# Patient Record
Sex: Female | Born: 1940
Health system: Southern US, Community
[De-identification: ages and names within clinical notes are randomized; demographics above are authoritative.]

## PROBLEM LIST (undated history)

## (undated) DIAGNOSIS — I350 Nonrheumatic aortic (valve) stenosis: Secondary | ICD-10-CM

## (undated) DIAGNOSIS — I10 Essential (primary) hypertension: Secondary | ICD-10-CM

## (undated) DIAGNOSIS — Z87442 Personal history of urinary calculi: Secondary | ICD-10-CM

## (undated) DIAGNOSIS — G4730 Sleep apnea, unspecified: Secondary | ICD-10-CM

## (undated) DIAGNOSIS — H8110 Benign paroxysmal vertigo, unspecified ear: Secondary | ICD-10-CM

## (undated) DIAGNOSIS — E7800 Pure hypercholesterolemia, unspecified: Secondary | ICD-10-CM

## (undated) DIAGNOSIS — I6529 Occlusion and stenosis of unspecified carotid artery: Secondary | ICD-10-CM

## (undated) HISTORY — DX: Pure hypercholesterolemia, unspecified: E78.00

## (undated) HISTORY — DX: Benign paroxysmal vertigo, unspecified ear: H81.10

## (undated) HISTORY — DX: Occlusion and stenosis of unspecified carotid artery: I65.29

## (undated) HISTORY — DX: Essential (primary) hypertension: I10

## (undated) HISTORY — PX: COARCTATION OF AORTA REPAIR: SHX261

## (undated) HISTORY — DX: Nonrheumatic aortic (valve) stenosis: I35.0

## (undated) HISTORY — DX: Personal history of urinary calculi: Z87.442

## (undated) HISTORY — DX: Sleep apnea, unspecified: G47.30

## (undated) HISTORY — PX: BREAST BIOPSY: SHX20

---

## 2011-03-08 DIAGNOSIS — H8110 Benign paroxysmal vertigo, unspecified ear: Secondary | ICD-10-CM | POA: Diagnosis not present

## 2011-03-08 DIAGNOSIS — I10 Essential (primary) hypertension: Secondary | ICD-10-CM | POA: Diagnosis not present

## 2011-03-17 DIAGNOSIS — I10 Essential (primary) hypertension: Secondary | ICD-10-CM | POA: Diagnosis not present

## 2011-03-17 DIAGNOSIS — H905 Unspecified sensorineural hearing loss: Secondary | ICD-10-CM | POA: Diagnosis not present

## 2011-03-17 DIAGNOSIS — G4733 Obstructive sleep apnea (adult) (pediatric): Secondary | ICD-10-CM | POA: Diagnosis not present

## 2011-03-17 DIAGNOSIS — H8190 Unspecified disorder of vestibular function, unspecified ear: Secondary | ICD-10-CM | POA: Diagnosis not present

## 2011-04-11 DIAGNOSIS — R42 Dizziness and giddiness: Secondary | ICD-10-CM | POA: Diagnosis not present

## 2011-04-27 DIAGNOSIS — R42 Dizziness and giddiness: Secondary | ICD-10-CM | POA: Diagnosis not present

## 2011-04-27 DIAGNOSIS — H8110 Benign paroxysmal vertigo, unspecified ear: Secondary | ICD-10-CM | POA: Diagnosis not present

## 2011-04-27 DIAGNOSIS — H8190 Unspecified disorder of vestibular function, unspecified ear: Secondary | ICD-10-CM | POA: Diagnosis not present

## 2011-11-24 DIAGNOSIS — Z23 Encounter for immunization: Secondary | ICD-10-CM | POA: Diagnosis not present

## 2011-11-24 DIAGNOSIS — R82998 Other abnormal findings in urine: Secondary | ICD-10-CM | POA: Diagnosis not present

## 2011-11-24 DIAGNOSIS — I10 Essential (primary) hypertension: Secondary | ICD-10-CM | POA: Diagnosis not present

## 2011-11-24 DIAGNOSIS — E7800 Pure hypercholesterolemia, unspecified: Secondary | ICD-10-CM | POA: Diagnosis not present

## 2011-11-24 DIAGNOSIS — Z79899 Other long term (current) drug therapy: Secondary | ICD-10-CM | POA: Diagnosis not present

## 2011-11-24 DIAGNOSIS — F411 Generalized anxiety disorder: Secondary | ICD-10-CM | POA: Diagnosis not present

## 2012-12-07 DIAGNOSIS — I10 Essential (primary) hypertension: Secondary | ICD-10-CM | POA: Diagnosis not present

## 2012-12-07 DIAGNOSIS — Z79899 Other long term (current) drug therapy: Secondary | ICD-10-CM | POA: Diagnosis not present

## 2012-12-07 DIAGNOSIS — Z23 Encounter for immunization: Secondary | ICD-10-CM | POA: Diagnosis not present

## 2012-12-07 DIAGNOSIS — Z0000 Encounter for general adult medical examination without abnormal findings: Secondary | ICD-10-CM | POA: Diagnosis not present

## 2012-12-07 DIAGNOSIS — E7800 Pure hypercholesterolemia, unspecified: Secondary | ICD-10-CM | POA: Diagnosis not present

## 2012-12-07 DIAGNOSIS — F411 Generalized anxiety disorder: Secondary | ICD-10-CM | POA: Diagnosis not present

## 2013-02-12 DIAGNOSIS — I10 Essential (primary) hypertension: Secondary | ICD-10-CM | POA: Diagnosis not present

## 2013-02-12 DIAGNOSIS — R52 Pain, unspecified: Secondary | ICD-10-CM | POA: Diagnosis not present

## 2013-02-12 DIAGNOSIS — M549 Dorsalgia, unspecified: Secondary | ICD-10-CM | POA: Diagnosis not present

## 2013-09-24 DIAGNOSIS — M542 Cervicalgia: Secondary | ICD-10-CM | POA: Diagnosis not present

## 2013-09-24 DIAGNOSIS — I10 Essential (primary) hypertension: Secondary | ICD-10-CM | POA: Diagnosis not present

## 2013-10-29 DIAGNOSIS — H2510 Age-related nuclear cataract, unspecified eye: Secondary | ICD-10-CM | POA: Diagnosis not present

## 2013-10-29 DIAGNOSIS — H40019 Open angle with borderline findings, low risk, unspecified eye: Secondary | ICD-10-CM | POA: Diagnosis not present

## 2013-11-01 DIAGNOSIS — H40019 Open angle with borderline findings, low risk, unspecified eye: Secondary | ICD-10-CM | POA: Diagnosis not present

## 2013-11-11 DIAGNOSIS — H4011X0 Primary open-angle glaucoma, stage unspecified: Secondary | ICD-10-CM | POA: Diagnosis not present

## 2013-12-02 DIAGNOSIS — H4010X1 Unspecified open-angle glaucoma, mild stage: Secondary | ICD-10-CM | POA: Diagnosis not present

## 2013-12-23 DIAGNOSIS — Z23 Encounter for immunization: Secondary | ICD-10-CM | POA: Diagnosis not present

## 2013-12-30 DIAGNOSIS — H4011X2 Primary open-angle glaucoma, moderate stage: Secondary | ICD-10-CM | POA: Diagnosis not present

## 2013-12-30 DIAGNOSIS — H401133 Primary open-angle glaucoma, bilateral, severe stage: Secondary | ICD-10-CM

## 2013-12-30 DIAGNOSIS — H2513 Age-related nuclear cataract, bilateral: Secondary | ICD-10-CM

## 2013-12-30 DIAGNOSIS — H4011X3 Primary open-angle glaucoma, severe stage: Secondary | ICD-10-CM | POA: Diagnosis not present

## 2013-12-30 HISTORY — DX: Primary open-angle glaucoma, bilateral, severe stage: H40.1133

## 2013-12-30 HISTORY — DX: Age-related nuclear cataract, bilateral: H25.13

## 2014-01-03 DIAGNOSIS — I10 Essential (primary) hypertension: Secondary | ICD-10-CM | POA: Diagnosis not present

## 2014-01-03 DIAGNOSIS — G4733 Obstructive sleep apnea (adult) (pediatric): Secondary | ICD-10-CM | POA: Diagnosis not present

## 2014-01-03 DIAGNOSIS — Z0000 Encounter for general adult medical examination without abnormal findings: Secondary | ICD-10-CM | POA: Diagnosis not present

## 2014-02-14 DIAGNOSIS — H4011X3 Primary open-angle glaucoma, severe stage: Secondary | ICD-10-CM | POA: Diagnosis not present

## 2014-02-14 DIAGNOSIS — H4011X2 Primary open-angle glaucoma, moderate stage: Secondary | ICD-10-CM | POA: Diagnosis not present

## 2014-02-17 DIAGNOSIS — I10 Essential (primary) hypertension: Secondary | ICD-10-CM | POA: Diagnosis not present

## 2014-02-17 DIAGNOSIS — H409 Unspecified glaucoma: Secondary | ICD-10-CM | POA: Diagnosis not present

## 2014-04-01 DIAGNOSIS — Z79899 Other long term (current) drug therapy: Secondary | ICD-10-CM | POA: Diagnosis not present

## 2014-04-01 DIAGNOSIS — H44513 Absolute glaucoma, bilateral: Secondary | ICD-10-CM | POA: Diagnosis not present

## 2014-04-01 DIAGNOSIS — I10 Essential (primary) hypertension: Secondary | ICD-10-CM | POA: Diagnosis not present

## 2014-04-01 DIAGNOSIS — E780 Pure hypercholesterolemia: Secondary | ICD-10-CM | POA: Diagnosis not present

## 2014-05-02 DIAGNOSIS — N3001 Acute cystitis with hematuria: Secondary | ICD-10-CM | POA: Diagnosis not present

## 2014-05-05 DIAGNOSIS — N202 Calculus of kidney with calculus of ureter: Secondary | ICD-10-CM | POA: Diagnosis not present

## 2014-05-05 DIAGNOSIS — N132 Hydronephrosis with renal and ureteral calculous obstruction: Secondary | ICD-10-CM | POA: Diagnosis not present

## 2014-05-05 DIAGNOSIS — R310 Gross hematuria: Secondary | ICD-10-CM | POA: Diagnosis not present

## 2014-05-05 DIAGNOSIS — R109 Unspecified abdominal pain: Secondary | ICD-10-CM | POA: Diagnosis not present

## 2014-05-23 DIAGNOSIS — H4011X3 Primary open-angle glaucoma, severe stage: Secondary | ICD-10-CM | POA: Diagnosis not present

## 2014-05-23 DIAGNOSIS — H4011X2 Primary open-angle glaucoma, moderate stage: Secondary | ICD-10-CM | POA: Diagnosis not present

## 2014-06-23 DIAGNOSIS — H2513 Age-related nuclear cataract, bilateral: Secondary | ICD-10-CM | POA: Diagnosis not present

## 2014-06-23 DIAGNOSIS — H4011X3 Primary open-angle glaucoma, severe stage: Secondary | ICD-10-CM | POA: Diagnosis not present

## 2014-07-17 DIAGNOSIS — H4011X3 Primary open-angle glaucoma, severe stage: Secondary | ICD-10-CM | POA: Diagnosis not present

## 2014-08-04 DIAGNOSIS — H4011X3 Primary open-angle glaucoma, severe stage: Secondary | ICD-10-CM | POA: Diagnosis not present

## 2014-08-04 DIAGNOSIS — H4011X2 Primary open-angle glaucoma, moderate stage: Secondary | ICD-10-CM | POA: Diagnosis not present

## 2014-08-29 DIAGNOSIS — Z87442 Personal history of urinary calculi: Secondary | ICD-10-CM | POA: Diagnosis not present

## 2014-08-29 DIAGNOSIS — R319 Hematuria, unspecified: Secondary | ICD-10-CM | POA: Diagnosis not present

## 2014-08-29 DIAGNOSIS — I10 Essential (primary) hypertension: Secondary | ICD-10-CM | POA: Diagnosis not present

## 2014-09-03 DIAGNOSIS — H2513 Age-related nuclear cataract, bilateral: Secondary | ICD-10-CM | POA: Diagnosis not present

## 2014-09-03 DIAGNOSIS — H4011X3 Primary open-angle glaucoma, severe stage: Secondary | ICD-10-CM | POA: Diagnosis not present

## 2014-09-11 DIAGNOSIS — N3001 Acute cystitis with hematuria: Secondary | ICD-10-CM | POA: Diagnosis not present

## 2014-09-23 DIAGNOSIS — N1330 Unspecified hydronephrosis: Secondary | ICD-10-CM | POA: Diagnosis not present

## 2014-09-23 DIAGNOSIS — N200 Calculus of kidney: Secondary | ICD-10-CM | POA: Diagnosis not present

## 2014-09-23 DIAGNOSIS — N132 Hydronephrosis with renal and ureteral calculous obstruction: Secondary | ICD-10-CM | POA: Diagnosis not present

## 2014-09-30 DIAGNOSIS — Z9181 History of falling: Secondary | ICD-10-CM | POA: Diagnosis not present

## 2014-09-30 DIAGNOSIS — Z1389 Encounter for screening for other disorder: Secondary | ICD-10-CM | POA: Diagnosis not present

## 2014-09-30 DIAGNOSIS — I10 Essential (primary) hypertension: Secondary | ICD-10-CM | POA: Diagnosis not present

## 2014-11-24 DIAGNOSIS — Z23 Encounter for immunization: Secondary | ICD-10-CM | POA: Diagnosis not present

## 2014-12-19 DIAGNOSIS — H2513 Age-related nuclear cataract, bilateral: Secondary | ICD-10-CM | POA: Diagnosis not present

## 2014-12-19 DIAGNOSIS — H401133 Primary open-angle glaucoma, bilateral, severe stage: Secondary | ICD-10-CM | POA: Diagnosis not present

## 2015-01-08 DIAGNOSIS — H401133 Primary open-angle glaucoma, bilateral, severe stage: Secondary | ICD-10-CM | POA: Diagnosis not present

## 2015-02-09 DIAGNOSIS — Z79899 Other long term (current) drug therapy: Secondary | ICD-10-CM | POA: Diagnosis not present

## 2015-02-09 DIAGNOSIS — Z87442 Personal history of urinary calculi: Secondary | ICD-10-CM | POA: Diagnosis not present

## 2015-02-09 DIAGNOSIS — I10 Essential (primary) hypertension: Secondary | ICD-10-CM | POA: Diagnosis not present

## 2015-02-09 DIAGNOSIS — H44513 Absolute glaucoma, bilateral: Secondary | ICD-10-CM | POA: Diagnosis not present

## 2015-02-09 DIAGNOSIS — N183 Chronic kidney disease, stage 3 (moderate): Secondary | ICD-10-CM | POA: Diagnosis not present

## 2015-02-09 DIAGNOSIS — Z0000 Encounter for general adult medical examination without abnormal findings: Secondary | ICD-10-CM | POA: Diagnosis not present

## 2015-02-25 DIAGNOSIS — H401133 Primary open-angle glaucoma, bilateral, severe stage: Secondary | ICD-10-CM | POA: Diagnosis not present

## 2015-02-26 DIAGNOSIS — H2513 Age-related nuclear cataract, bilateral: Secondary | ICD-10-CM | POA: Diagnosis not present

## 2015-02-26 DIAGNOSIS — H401133 Primary open-angle glaucoma, bilateral, severe stage: Secondary | ICD-10-CM | POA: Diagnosis not present

## 2015-03-19 DIAGNOSIS — H401133 Primary open-angle glaucoma, bilateral, severe stage: Secondary | ICD-10-CM | POA: Diagnosis not present

## 2015-04-13 DIAGNOSIS — H401133 Primary open-angle glaucoma, bilateral, severe stage: Secondary | ICD-10-CM | POA: Diagnosis not present

## 2015-05-11 DIAGNOSIS — H401133 Primary open-angle glaucoma, bilateral, severe stage: Secondary | ICD-10-CM | POA: Diagnosis not present

## 2015-05-11 DIAGNOSIS — H2513 Age-related nuclear cataract, bilateral: Secondary | ICD-10-CM | POA: Diagnosis not present

## 2015-09-14 DIAGNOSIS — H2513 Age-related nuclear cataract, bilateral: Secondary | ICD-10-CM | POA: Diagnosis not present

## 2015-09-14 DIAGNOSIS — H401133 Primary open-angle glaucoma, bilateral, severe stage: Secondary | ICD-10-CM | POA: Diagnosis not present

## 2015-12-04 DIAGNOSIS — I10 Essential (primary) hypertension: Secondary | ICD-10-CM | POA: Diagnosis not present

## 2015-12-04 DIAGNOSIS — R05 Cough: Secondary | ICD-10-CM | POA: Diagnosis not present

## 2015-12-04 DIAGNOSIS — H6122 Impacted cerumen, left ear: Secondary | ICD-10-CM | POA: Diagnosis not present

## 2015-12-04 DIAGNOSIS — G4730 Sleep apnea, unspecified: Secondary | ICD-10-CM | POA: Diagnosis not present

## 2015-12-04 DIAGNOSIS — Z23 Encounter for immunization: Secondary | ICD-10-CM | POA: Diagnosis not present

## 2015-12-14 DIAGNOSIS — G4733 Obstructive sleep apnea (adult) (pediatric): Secondary | ICD-10-CM | POA: Diagnosis not present

## 2015-12-14 DIAGNOSIS — J301 Allergic rhinitis due to pollen: Secondary | ICD-10-CM | POA: Diagnosis not present

## 2015-12-22 DIAGNOSIS — G4733 Obstructive sleep apnea (adult) (pediatric): Secondary | ICD-10-CM | POA: Diagnosis not present

## 2016-02-02 DIAGNOSIS — G4733 Obstructive sleep apnea (adult) (pediatric): Secondary | ICD-10-CM | POA: Diagnosis not present

## 2016-02-03 DIAGNOSIS — J301 Allergic rhinitis due to pollen: Secondary | ICD-10-CM | POA: Diagnosis not present

## 2016-02-03 DIAGNOSIS — G4733 Obstructive sleep apnea (adult) (pediatric): Secondary | ICD-10-CM | POA: Diagnosis not present

## 2016-02-12 DIAGNOSIS — I10 Essential (primary) hypertension: Secondary | ICD-10-CM | POA: Diagnosis not present

## 2016-02-12 DIAGNOSIS — Z1389 Encounter for screening for other disorder: Secondary | ICD-10-CM | POA: Diagnosis not present

## 2016-02-12 DIAGNOSIS — E785 Hyperlipidemia, unspecified: Secondary | ICD-10-CM | POA: Diagnosis not present

## 2016-02-12 DIAGNOSIS — Z79899 Other long term (current) drug therapy: Secondary | ICD-10-CM | POA: Diagnosis not present

## 2016-02-12 DIAGNOSIS — Z0000 Encounter for general adult medical examination without abnormal findings: Secondary | ICD-10-CM | POA: Diagnosis not present

## 2016-03-03 DIAGNOSIS — H401133 Primary open-angle glaucoma, bilateral, severe stage: Secondary | ICD-10-CM | POA: Diagnosis not present

## 2016-03-07 DIAGNOSIS — H2513 Age-related nuclear cataract, bilateral: Secondary | ICD-10-CM | POA: Diagnosis not present

## 2016-03-07 DIAGNOSIS — H401133 Primary open-angle glaucoma, bilateral, severe stage: Secondary | ICD-10-CM | POA: Diagnosis not present

## 2016-06-09 DIAGNOSIS — G4733 Obstructive sleep apnea (adult) (pediatric): Secondary | ICD-10-CM | POA: Diagnosis not present

## 2016-06-10 DIAGNOSIS — G4733 Obstructive sleep apnea (adult) (pediatric): Secondary | ICD-10-CM | POA: Diagnosis not present

## 2016-06-10 DIAGNOSIS — J301 Allergic rhinitis due to pollen: Secondary | ICD-10-CM | POA: Diagnosis not present

## 2016-07-05 DIAGNOSIS — H401133 Primary open-angle glaucoma, bilateral, severe stage: Secondary | ICD-10-CM | POA: Diagnosis not present

## 2016-07-13 DIAGNOSIS — H401133 Primary open-angle glaucoma, bilateral, severe stage: Secondary | ICD-10-CM | POA: Diagnosis not present

## 2016-07-13 DIAGNOSIS — H2513 Age-related nuclear cataract, bilateral: Secondary | ICD-10-CM | POA: Diagnosis not present

## 2016-08-25 DIAGNOSIS — H2513 Age-related nuclear cataract, bilateral: Secondary | ICD-10-CM | POA: Diagnosis not present

## 2016-08-25 DIAGNOSIS — H401133 Primary open-angle glaucoma, bilateral, severe stage: Secondary | ICD-10-CM | POA: Diagnosis not present

## 2016-11-15 DIAGNOSIS — I10 Essential (primary) hypertension: Secondary | ICD-10-CM | POA: Diagnosis not present

## 2016-11-15 DIAGNOSIS — M75102 Unspecified rotator cuff tear or rupture of left shoulder, not specified as traumatic: Secondary | ICD-10-CM | POA: Diagnosis not present

## 2016-11-15 DIAGNOSIS — Z23 Encounter for immunization: Secondary | ICD-10-CM | POA: Diagnosis not present

## 2016-11-15 DIAGNOSIS — Z1389 Encounter for screening for other disorder: Secondary | ICD-10-CM | POA: Diagnosis not present

## 2016-12-21 DIAGNOSIS — M7502 Adhesive capsulitis of left shoulder: Secondary | ICD-10-CM | POA: Diagnosis not present

## 2016-12-23 DIAGNOSIS — H401133 Primary open-angle glaucoma, bilateral, severe stage: Secondary | ICD-10-CM | POA: Diagnosis not present

## 2016-12-30 DIAGNOSIS — M7502 Adhesive capsulitis of left shoulder: Secondary | ICD-10-CM | POA: Diagnosis not present

## 2017-01-02 DIAGNOSIS — M7502 Adhesive capsulitis of left shoulder: Secondary | ICD-10-CM | POA: Diagnosis not present

## 2017-01-05 DIAGNOSIS — M7502 Adhesive capsulitis of left shoulder: Secondary | ICD-10-CM | POA: Diagnosis not present

## 2017-01-09 DIAGNOSIS — M7502 Adhesive capsulitis of left shoulder: Secondary | ICD-10-CM | POA: Diagnosis not present

## 2017-01-12 DIAGNOSIS — M7502 Adhesive capsulitis of left shoulder: Secondary | ICD-10-CM | POA: Diagnosis not present

## 2017-01-16 DIAGNOSIS — M7502 Adhesive capsulitis of left shoulder: Secondary | ICD-10-CM | POA: Diagnosis not present

## 2017-01-23 DIAGNOSIS — M7502 Adhesive capsulitis of left shoulder: Secondary | ICD-10-CM | POA: Diagnosis not present

## 2017-01-24 DIAGNOSIS — M7502 Adhesive capsulitis of left shoulder: Secondary | ICD-10-CM | POA: Diagnosis not present

## 2017-01-26 DIAGNOSIS — M7502 Adhesive capsulitis of left shoulder: Secondary | ICD-10-CM | POA: Diagnosis not present

## 2017-01-30 DIAGNOSIS — M7502 Adhesive capsulitis of left shoulder: Secondary | ICD-10-CM | POA: Diagnosis not present

## 2017-02-02 DIAGNOSIS — M7502 Adhesive capsulitis of left shoulder: Secondary | ICD-10-CM | POA: Diagnosis not present

## 2017-02-09 DIAGNOSIS — M7502 Adhesive capsulitis of left shoulder: Secondary | ICD-10-CM | POA: Diagnosis not present

## 2017-02-13 DIAGNOSIS — M7502 Adhesive capsulitis of left shoulder: Secondary | ICD-10-CM | POA: Diagnosis not present

## 2017-02-14 DIAGNOSIS — M7502 Adhesive capsulitis of left shoulder: Secondary | ICD-10-CM | POA: Diagnosis not present

## 2017-02-15 DIAGNOSIS — H2513 Age-related nuclear cataract, bilateral: Secondary | ICD-10-CM | POA: Diagnosis not present

## 2017-02-15 DIAGNOSIS — H401133 Primary open-angle glaucoma, bilateral, severe stage: Secondary | ICD-10-CM | POA: Diagnosis not present

## 2017-02-23 DIAGNOSIS — M7502 Adhesive capsulitis of left shoulder: Secondary | ICD-10-CM | POA: Diagnosis not present

## 2017-02-27 DIAGNOSIS — G4733 Obstructive sleep apnea (adult) (pediatric): Secondary | ICD-10-CM | POA: Diagnosis not present

## 2017-03-01 DIAGNOSIS — J301 Allergic rhinitis due to pollen: Secondary | ICD-10-CM | POA: Diagnosis not present

## 2017-03-01 DIAGNOSIS — G4733 Obstructive sleep apnea (adult) (pediatric): Secondary | ICD-10-CM | POA: Diagnosis not present

## 2017-03-03 DIAGNOSIS — I10 Essential (primary) hypertension: Secondary | ICD-10-CM | POA: Diagnosis not present

## 2017-03-03 DIAGNOSIS — Z0000 Encounter for general adult medical examination without abnormal findings: Secondary | ICD-10-CM | POA: Diagnosis not present

## 2017-03-03 DIAGNOSIS — M1990 Unspecified osteoarthritis, unspecified site: Secondary | ICD-10-CM | POA: Diagnosis not present

## 2017-03-03 DIAGNOSIS — Z23 Encounter for immunization: Secondary | ICD-10-CM | POA: Diagnosis not present

## 2017-03-29 DIAGNOSIS — H401133 Primary open-angle glaucoma, bilateral, severe stage: Secondary | ICD-10-CM | POA: Diagnosis not present

## 2017-04-13 DIAGNOSIS — Z6837 Body mass index (BMI) 37.0-37.9, adult: Secondary | ICD-10-CM | POA: Diagnosis not present

## 2017-04-13 DIAGNOSIS — I10 Essential (primary) hypertension: Secondary | ICD-10-CM | POA: Diagnosis not present

## 2017-06-06 DIAGNOSIS — H401133 Primary open-angle glaucoma, bilateral, severe stage: Secondary | ICD-10-CM | POA: Diagnosis not present

## 2017-06-07 DIAGNOSIS — H401133 Primary open-angle glaucoma, bilateral, severe stage: Secondary | ICD-10-CM | POA: Diagnosis not present

## 2017-06-07 DIAGNOSIS — H2513 Age-related nuclear cataract, bilateral: Secondary | ICD-10-CM | POA: Diagnosis not present

## 2017-06-12 DIAGNOSIS — H401133 Primary open-angle glaucoma, bilateral, severe stage: Secondary | ICD-10-CM | POA: Diagnosis not present

## 2017-07-05 DIAGNOSIS — H2513 Age-related nuclear cataract, bilateral: Secondary | ICD-10-CM | POA: Diagnosis not present

## 2017-07-05 DIAGNOSIS — H401133 Primary open-angle glaucoma, bilateral, severe stage: Secondary | ICD-10-CM | POA: Diagnosis not present

## 2017-07-20 DIAGNOSIS — H401133 Primary open-angle glaucoma, bilateral, severe stage: Secondary | ICD-10-CM | POA: Diagnosis not present

## 2017-08-30 DIAGNOSIS — H401133 Primary open-angle glaucoma, bilateral, severe stage: Secondary | ICD-10-CM | POA: Diagnosis not present

## 2017-08-30 DIAGNOSIS — H2513 Age-related nuclear cataract, bilateral: Secondary | ICD-10-CM | POA: Diagnosis not present

## 2017-11-15 DIAGNOSIS — I10 Essential (primary) hypertension: Secondary | ICD-10-CM | POA: Diagnosis not present

## 2017-11-15 DIAGNOSIS — Z23 Encounter for immunization: Secondary | ICD-10-CM | POA: Diagnosis not present

## 2017-11-15 DIAGNOSIS — Z6827 Body mass index (BMI) 27.0-27.9, adult: Secondary | ICD-10-CM | POA: Diagnosis not present

## 2017-12-06 DIAGNOSIS — H401133 Primary open-angle glaucoma, bilateral, severe stage: Secondary | ICD-10-CM | POA: Diagnosis not present

## 2017-12-06 DIAGNOSIS — H2513 Age-related nuclear cataract, bilateral: Secondary | ICD-10-CM | POA: Diagnosis not present

## 2018-01-11 DIAGNOSIS — H401133 Primary open-angle glaucoma, bilateral, severe stage: Secondary | ICD-10-CM | POA: Diagnosis not present

## 2018-02-09 DIAGNOSIS — H2513 Age-related nuclear cataract, bilateral: Secondary | ICD-10-CM | POA: Diagnosis not present

## 2018-02-09 DIAGNOSIS — H401133 Primary open-angle glaucoma, bilateral, severe stage: Secondary | ICD-10-CM | POA: Diagnosis not present

## 2018-02-22 DIAGNOSIS — G4733 Obstructive sleep apnea (adult) (pediatric): Secondary | ICD-10-CM | POA: Diagnosis not present

## 2018-03-07 DIAGNOSIS — H401133 Primary open-angle glaucoma, bilateral, severe stage: Secondary | ICD-10-CM | POA: Diagnosis not present

## 2018-03-07 DIAGNOSIS — H2513 Age-related nuclear cataract, bilateral: Secondary | ICD-10-CM | POA: Diagnosis not present

## 2018-03-09 DIAGNOSIS — J310 Chronic rhinitis: Secondary | ICD-10-CM | POA: Diagnosis not present

## 2018-03-09 DIAGNOSIS — G4733 Obstructive sleep apnea (adult) (pediatric): Secondary | ICD-10-CM | POA: Diagnosis not present

## 2018-04-18 DIAGNOSIS — E785 Hyperlipidemia, unspecified: Secondary | ICD-10-CM | POA: Diagnosis not present

## 2018-04-18 DIAGNOSIS — I10 Essential (primary) hypertension: Secondary | ICD-10-CM | POA: Diagnosis not present

## 2018-04-18 DIAGNOSIS — Z0000 Encounter for general adult medical examination without abnormal findings: Secondary | ICD-10-CM | POA: Diagnosis not present

## 2018-04-18 DIAGNOSIS — Z6827 Body mass index (BMI) 27.0-27.9, adult: Secondary | ICD-10-CM | POA: Diagnosis not present

## 2018-04-18 DIAGNOSIS — Z9181 History of falling: Secondary | ICD-10-CM | POA: Diagnosis not present

## 2018-04-18 DIAGNOSIS — Z79899 Other long term (current) drug therapy: Secondary | ICD-10-CM | POA: Diagnosis not present

## 2018-04-23 DIAGNOSIS — H401133 Primary open-angle glaucoma, bilateral, severe stage: Secondary | ICD-10-CM | POA: Diagnosis not present

## 2018-04-23 DIAGNOSIS — H2513 Age-related nuclear cataract, bilateral: Secondary | ICD-10-CM | POA: Diagnosis not present

## 2018-07-16 DIAGNOSIS — H401133 Primary open-angle glaucoma, bilateral, severe stage: Secondary | ICD-10-CM | POA: Diagnosis not present

## 2018-07-18 DIAGNOSIS — H401133 Primary open-angle glaucoma, bilateral, severe stage: Secondary | ICD-10-CM | POA: Diagnosis not present

## 2018-07-18 DIAGNOSIS — H2513 Age-related nuclear cataract, bilateral: Secondary | ICD-10-CM | POA: Diagnosis not present

## 2018-12-08 DIAGNOSIS — Z23 Encounter for immunization: Secondary | ICD-10-CM | POA: Diagnosis not present

## 2019-01-18 DIAGNOSIS — H401133 Primary open-angle glaucoma, bilateral, severe stage: Secondary | ICD-10-CM | POA: Diagnosis not present

## 2019-01-28 DIAGNOSIS — H2513 Age-related nuclear cataract, bilateral: Secondary | ICD-10-CM | POA: Diagnosis not present

## 2019-01-28 DIAGNOSIS — H401133 Primary open-angle glaucoma, bilateral, severe stage: Secondary | ICD-10-CM | POA: Diagnosis not present

## 2019-04-22 DIAGNOSIS — Z6834 Body mass index (BMI) 34.0-34.9, adult: Secondary | ICD-10-CM | POA: Diagnosis not present

## 2019-04-22 DIAGNOSIS — Z0000 Encounter for general adult medical examination without abnormal findings: Secondary | ICD-10-CM | POA: Diagnosis not present

## 2019-04-22 DIAGNOSIS — Z1331 Encounter for screening for depression: Secondary | ICD-10-CM | POA: Diagnosis not present

## 2019-04-22 DIAGNOSIS — Z79899 Other long term (current) drug therapy: Secondary | ICD-10-CM | POA: Diagnosis not present

## 2019-04-22 DIAGNOSIS — I10 Essential (primary) hypertension: Secondary | ICD-10-CM | POA: Diagnosis not present

## 2019-04-22 DIAGNOSIS — E7800 Pure hypercholesterolemia, unspecified: Secondary | ICD-10-CM | POA: Diagnosis not present

## 2019-04-30 DIAGNOSIS — L820 Inflamed seborrheic keratosis: Secondary | ICD-10-CM | POA: Diagnosis not present

## 2019-04-30 DIAGNOSIS — L821 Other seborrheic keratosis: Secondary | ICD-10-CM | POA: Diagnosis not present

## 2019-04-30 DIAGNOSIS — L578 Other skin changes due to chronic exposure to nonionizing radiation: Secondary | ICD-10-CM | POA: Diagnosis not present

## 2019-05-22 DIAGNOSIS — H401133 Primary open-angle glaucoma, bilateral, severe stage: Secondary | ICD-10-CM | POA: Diagnosis not present

## 2019-05-29 DIAGNOSIS — H2513 Age-related nuclear cataract, bilateral: Secondary | ICD-10-CM | POA: Diagnosis not present

## 2019-05-29 DIAGNOSIS — H401133 Primary open-angle glaucoma, bilateral, severe stage: Secondary | ICD-10-CM | POA: Diagnosis not present

## 2019-05-29 DIAGNOSIS — H353132 Nonexudative age-related macular degeneration, bilateral, intermediate dry stage: Secondary | ICD-10-CM | POA: Diagnosis not present

## 2019-09-30 DIAGNOSIS — H401133 Primary open-angle glaucoma, bilateral, severe stage: Secondary | ICD-10-CM | POA: Diagnosis not present

## 2019-10-07 DIAGNOSIS — H401133 Primary open-angle glaucoma, bilateral, severe stage: Secondary | ICD-10-CM | POA: Diagnosis not present

## 2019-10-07 DIAGNOSIS — H2513 Age-related nuclear cataract, bilateral: Secondary | ICD-10-CM | POA: Diagnosis not present

## 2019-10-07 DIAGNOSIS — H353132 Nonexudative age-related macular degeneration, bilateral, intermediate dry stage: Secondary | ICD-10-CM | POA: Diagnosis not present

## 2019-11-28 DIAGNOSIS — Z23 Encounter for immunization: Secondary | ICD-10-CM | POA: Diagnosis not present

## 2019-12-28 DIAGNOSIS — E7849 Other hyperlipidemia: Secondary | ICD-10-CM | POA: Diagnosis not present

## 2019-12-28 DIAGNOSIS — I10 Essential (primary) hypertension: Secondary | ICD-10-CM | POA: Diagnosis not present

## 2020-01-26 DIAGNOSIS — M79672 Pain in left foot: Secondary | ICD-10-CM | POA: Diagnosis not present

## 2020-01-26 DIAGNOSIS — M009 Pyogenic arthritis, unspecified: Secondary | ICD-10-CM | POA: Diagnosis not present

## 2020-01-26 DIAGNOSIS — M109 Gout, unspecified: Secondary | ICD-10-CM | POA: Diagnosis not present

## 2020-01-28 DIAGNOSIS — I10 Essential (primary) hypertension: Secondary | ICD-10-CM | POA: Diagnosis not present

## 2020-01-28 DIAGNOSIS — E7849 Other hyperlipidemia: Secondary | ICD-10-CM | POA: Diagnosis not present

## 2020-02-12 DIAGNOSIS — Z6827 Body mass index (BMI) 27.0-27.9, adult: Secondary | ICD-10-CM | POA: Diagnosis not present

## 2020-02-12 DIAGNOSIS — J309 Allergic rhinitis, unspecified: Secondary | ICD-10-CM | POA: Diagnosis not present

## 2020-02-12 DIAGNOSIS — M79672 Pain in left foot: Secondary | ICD-10-CM | POA: Diagnosis not present

## 2020-02-27 DIAGNOSIS — I10 Essential (primary) hypertension: Secondary | ICD-10-CM | POA: Diagnosis not present

## 2020-02-27 DIAGNOSIS — E7800 Pure hypercholesterolemia, unspecified: Secondary | ICD-10-CM | POA: Diagnosis not present

## 2020-02-27 DIAGNOSIS — G4730 Sleep apnea, unspecified: Secondary | ICD-10-CM | POA: Diagnosis not present

## 2020-04-08 DIAGNOSIS — H401133 Primary open-angle glaucoma, bilateral, severe stage: Secondary | ICD-10-CM | POA: Diagnosis not present

## 2020-04-15 DIAGNOSIS — H25813 Combined forms of age-related cataract, bilateral: Secondary | ICD-10-CM | POA: Diagnosis not present

## 2020-04-15 DIAGNOSIS — H401133 Primary open-angle glaucoma, bilateral, severe stage: Secondary | ICD-10-CM | POA: Diagnosis not present

## 2020-04-23 DIAGNOSIS — Z6827 Body mass index (BMI) 27.0-27.9, adult: Secondary | ICD-10-CM | POA: Diagnosis not present

## 2020-04-23 DIAGNOSIS — I10 Essential (primary) hypertension: Secondary | ICD-10-CM | POA: Diagnosis not present

## 2020-04-23 DIAGNOSIS — Z1331 Encounter for screening for depression: Secondary | ICD-10-CM | POA: Diagnosis not present

## 2020-04-23 DIAGNOSIS — E7800 Pure hypercholesterolemia, unspecified: Secondary | ICD-10-CM | POA: Diagnosis not present

## 2020-04-23 DIAGNOSIS — Z79899 Other long term (current) drug therapy: Secondary | ICD-10-CM | POA: Diagnosis not present

## 2020-04-23 DIAGNOSIS — Z0000 Encounter for general adult medical examination without abnormal findings: Secondary | ICD-10-CM | POA: Diagnosis not present

## 2020-07-17 DIAGNOSIS — Z23 Encounter for immunization: Secondary | ICD-10-CM | POA: Diagnosis not present

## 2020-07-20 DIAGNOSIS — R0600 Dyspnea, unspecified: Secondary | ICD-10-CM | POA: Diagnosis not present

## 2020-07-20 DIAGNOSIS — Z6828 Body mass index (BMI) 28.0-28.9, adult: Secondary | ICD-10-CM | POA: Diagnosis not present

## 2020-07-22 ENCOUNTER — Encounter: Payer: Self-pay | Admitting: *Deleted

## 2020-07-22 ENCOUNTER — Encounter: Payer: Self-pay | Admitting: Cardiology

## 2020-07-22 DIAGNOSIS — E7800 Pure hypercholesterolemia, unspecified: Secondary | ICD-10-CM | POA: Insufficient documentation

## 2020-07-22 DIAGNOSIS — H8110 Benign paroxysmal vertigo, unspecified ear: Secondary | ICD-10-CM | POA: Insufficient documentation

## 2020-07-22 DIAGNOSIS — Z87442 Personal history of urinary calculi: Secondary | ICD-10-CM | POA: Insufficient documentation

## 2020-07-22 DIAGNOSIS — G4730 Sleep apnea, unspecified: Secondary | ICD-10-CM | POA: Insufficient documentation

## 2020-07-22 DIAGNOSIS — I10 Essential (primary) hypertension: Secondary | ICD-10-CM | POA: Insufficient documentation

## 2020-07-23 NOTE — Progress Notes (Signed)
Cardiology Office Note:    Date:  07/24/2020   ID:  Stephanie Rosario, DOB Feb 13, 1941, MRN [id number]  PCP:  Marylen Ponto, MD  Cardiologist:  Norman Herrlich, MD   Referring MD: Marylen Ponto, MD  ASSESSMENT:    1. Precordial pain   2. Benign essential hypertension   3. History of repair of coarctation of aorta   4. Nonrheumatic aortic valve stenosis    PLAN:    In order of problems listed above:  1. She is having typical angina.  Physical exam consistent with severe aortic stenosis to be worked into her schedule in the next week in the office for echocardiogram if confirmatory referral for left and right heart catheterization consideration of intervention TAVR.  Differential diagnosis includes obstructive hypertrophic cardiomyopathy.  She may also have coincident CAD her pattern of chest pain is not unstable. 2. Stable continue current treatment she is being bothered by frequent episodes of gout and ideally should be off the diuretic I told her we will discuss at the next visit 3. Very unusual history I doubt it is related to her current presentation 4. Exam is very typical and symptoms chest pain shortness of breath and near syncope as well quick echocardiogram for confirmation  Next appointment 4 weeks   Medication Adjustments/Labs and Tests Ordered: Current medicines are reviewed at length with the patient today.  Concerns regarding medicines are outlined above.  Orders Placed This Encounter  Procedures  . EKG 12-Lead  . ECHOCARDIOGRAM COMPLETE   No orders of the defined types were placed in this encounter.    Chief Complaint  Patient presents with  . Shortness of Breath    She has a history of coarctation of aorta congenital heart with surgical correction as a young adult    History of Present Illness:    Stephanie Rosario is a 80 y.o. female with a history of hypertension hyperlipidemia and sleep apnea who is being seen today for the evaluation of shortness of breath at  the request of Marylen Ponto, MD.  Her PCP note says that she has correction of coarctation of aorta as a young adult age 31.  She remains very active with activities like kayaking and provided she controls the pace and still doing recreational activities  Recent labs 04/23/2020 CBC with a hemoglobin of 12.8 platelets 107,000 BUN elevated 42 creatinine elevated at 1.3 GFR reduced 40 cc/min stage III CKD cholesterol 181 LDL 99 triglycerides 189 HDL 44   She was found to have coarctation of aorta at age 8 and had surgery at [facility] and age 89 in 1962 She had brief postoperative follow-up with her surgeon and no other medical cardiology care. She has a grandson has had surgical correction of transposition of the great vessels which she has no other known family history of congenital heart disease. She remains a very vigorous and active woman but in the last month she has had a stable pattern of very typical angina if she climbs stairs and while she is outside to the car she will get progression of the chest central severe shortness of breath and puts a clenched fist to her breast to describe it, a Levine sign.  The last episode was more severe and she had near loss of consciousness with it.  She has 1-2 episodes a week her pattern is stable does not occur at night and does not occur with rest.  She has had no edema orthopnea.  She is not having palpitation.  Her physical examination is consistent with severe aortic stenosis. Past Medical History:  Diagnosis Date  . Benign essential hypertension   . Benign paroxysmal positional vertigo   . History of nephrolithiasis   . Nuclear sclerotic cataract of both eyes 12/30/2013  . Primary open angle glaucoma of both eyes, severe stage 12/30/2013  . Pure hypercholesterolemia   . Sleep apnea    CPAP    Past Surgical History:  Procedure Laterality Date  . BREAST BIOPSY     Benign  . COARCTATION OF AORTA REPAIR     Age 49    Current Medications: Current  Meds  Medication Sig  . bisoprolol-hydrochlorothiazide (ZIAC) 10-6.25 MG tablet Take 0.5 tablets by mouth daily.  . brimonidine (ALPHAGAN) 0.2 % ophthalmic solution Place 1 drop into both eyes 3 (three) times daily.  . dorzolamide (TRUSOPT) 2 % ophthalmic solution Place 1 drop into both eyes 3 (three) times daily.  . fluticasone (FLONASE) 50 MCG/ACT nasal spray Place 2 sprays into both nostrils as needed for congestion.  . Latanoprostene Bunod 0.024 % SOLN Place 1 drop into both eyes at bedtime.  [place]wood Iles Dimesylate (RHOPRESSA) 0.02 % SOLN Place 1 drop into both eyes at bedtime.  . ramipril (ALTACE) 5 MG capsule Take 5 mg by mouth daily.  Marland Kitchen triamterene-hydrochlorothiazide (MAXZIDE-25) 37.5-25 MG tablet Take 0.5 tablets by mouth daily.     Allergies:   Sulfa antibiotics   Social History   Socioeconomic History  . Marital status: Unknown    Spouse name: Not on file  . Number of children: Not on file  . Years of education: Not on file  . Highest education level: Not on file  Occupational History  . Not on file  Tobacco Use  . Smoking status: Never Smoker  . Smokeless tobacco: Never Used  Substance and Sexual Activity  . Alcohol use: Yes    Alcohol/week: 1.0 standard drink    Types: 1 Glasses of wine per week    Comment: Rare  . Drug use: Never  . Sexual activity: Not on file  Other Topics Concern  . Not on file  Social History Narrative  . Not on file   Social Determinants of Health   Financial Resource Strain: Not on file  Food Insecurity: Not on file  Transportation Needs: Not on file  Physical Activity: Not on file  Stress: Not on file  Social Connections: Not on file     Family History: The patient's family history includes Congestive Heart Failure in her father; Heart attack in her mother; Hypertension in her sister.  ROS:   ROS Please see the history of present illness.     All other systems reviewed and are negative.  EKGs/Labs/Other Studies Reviewed:     The following studies were reviewed today:   EKG:  EKG is  ordered today.  The ekg ordered today is personally reviewed and demonstrates sinus rhythm pattern most consistent with LVH versus old anterior septal MI early repolarization versus ischemia no previous EKG to compare  Recent Labs: No results found for requested labs within last 8760 hours.  Recent Lipid Panel No results found for: CHOL, TRIG, HDL, CHOLHDL, VLDL, LDLCALC, LDLDIRECT  Physical Exam:    VS:  BP 138/75   Pulse (!) 58   Ht 5\' 2"  (1.575 m)   Wt 149 lb 3.2 oz (67.7 kg)   SpO2 98%   BMI 27.29 kg/m     Wt Readings from Last 3 Encounters:  07/24/20 149  lb 3.2 oz (67.7 kg)  07/20/20 158 lb (71.7 kg)     GEN: Looks younger than her age well nourished, well developed in no acute distress HEENT: Normal NECK: No JVD; No carotid bruits LYMPHATICS: No lymphadenopathy CARDIAC: Typical findings of aortic stenosis harsh grunting outflow murmur from the apex through the carotids grade 3/6 to 4/6 encompasses S2 diminished carotid upstroke radiates to the carotids no aortic RRR, no  rubs, gallops RESPIRATORY:  Clear to auscultation without rales, wheezing or rhonchi  ABDOMEN: Soft, non-tender, non-distended MUSCULOSKELETAL:  No edema; No deformity  SKIN: Warm and dry NEUROLOGIC:  Alert and oriented x 3 PSYCHIATRIC:  Normal affect     Signed, Norman Herrlich, MD  07/24/2020 12:07 PM    [facility]

## 2020-07-24 ENCOUNTER — Encounter: Payer: Self-pay | Admitting: Cardiology

## 2020-07-24 ENCOUNTER — Ambulatory Visit (INDEPENDENT_AMBULATORY_CARE_PROVIDER_SITE_OTHER): Payer: Medicare Other | Admitting: Cardiology

## 2020-07-24 ENCOUNTER — Other Ambulatory Visit: Payer: Self-pay

## 2020-07-24 VITALS — BP 138/75 | HR 58 | Ht 62.0 in | Wt 149.2 lb

## 2020-07-24 DIAGNOSIS — Z8774 Personal history of (corrected) congenital malformations of heart and circulatory system: Secondary | ICD-10-CM

## 2020-07-24 DIAGNOSIS — I350 Nonrheumatic aortic (valve) stenosis: Secondary | ICD-10-CM | POA: Diagnosis not present

## 2020-07-24 DIAGNOSIS — R0602 Shortness of breath: Secondary | ICD-10-CM

## 2020-07-24 DIAGNOSIS — R072 Precordial pain: Secondary | ICD-10-CM

## 2020-07-24 DIAGNOSIS — I10 Essential (primary) hypertension: Secondary | ICD-10-CM | POA: Diagnosis not present

## 2020-07-24 HISTORY — DX: Shortness of breath: R06.02

## 2020-07-24 NOTE — Patient Instructions (Signed)
Medication Instructions:  Your physician recommends that you continue on your current medications as directed. Please refer to the Current Medication list given to you today.  *If you need a refill on your cardiac medications before your next appointment, please call your pharmacy*   Lab Work: NOne If you have labs (blood work) drawn today and your tests are completely normal, you will receive your results only by: Marland Kitchen MyChart Message (if you have MyChart) OR . A paper copy in the mail If you have any lab test that is abnormal or we need to change your treatment, we will call you to review the results.   Testing/Procedures: Your physician has requested that you have an echocardiogram. Echocardiography is a painless test that uses sound waves to create images of your heart. It provides your doctor with information about the size and shape of your heart and how well your heart's chambers and valves are working. This procedure takes approximately one hour. There are no restrictions for this procedure.     Follow-Up: At [facility], you and your health needs are our priority.  As part of our continuing mission to provide you with exceptional heart care, we have created designated Provider Care Teams.  These Care Teams include your primary Cardiologist (physician) and Advanced Practice Providers (APPs -  Physician Assistants and Nurse Practitioners) who all work together to provide you with the care you need, when you need it.  We recommend signing up for the patient portal called "MyChart".  Sign up information is provided on this After Visit Summary.  MyChart is used to connect with patients for Virtual Visits (Telemedicine).  Patients are able to view lab/test results, encounter notes, upcoming appointments, etc.  Non-urgent messages can be sent to your provider as well.   To learn more about what you can do with MyChart, go to [email or web address].    Your next appointment:   1  month(s)  The format for your next appointment:   In Person  Provider:   Norman Herrlich, MD   Other Instructions

## 2020-07-29 ENCOUNTER — Other Ambulatory Visit: Payer: Medicare Other

## 2020-07-30 ENCOUNTER — Encounter: Payer: Self-pay | Admitting: Cardiology

## 2020-07-30 ENCOUNTER — Other Ambulatory Visit: Payer: Self-pay

## 2020-07-30 ENCOUNTER — Ambulatory Visit (INDEPENDENT_AMBULATORY_CARE_PROVIDER_SITE_OTHER): Payer: Medicare Other | Admitting: Cardiology

## 2020-07-30 ENCOUNTER — Ambulatory Visit (INDEPENDENT_AMBULATORY_CARE_PROVIDER_SITE_OTHER): Payer: Medicare Other

## 2020-07-30 VITALS — BP 142/70 | HR 86 | Ht 62.0 in | Wt 149.0 lb

## 2020-07-30 DIAGNOSIS — R072 Precordial pain: Secondary | ICD-10-CM

## 2020-07-30 DIAGNOSIS — I350 Nonrheumatic aortic (valve) stenosis: Secondary | ICD-10-CM

## 2020-07-30 DIAGNOSIS — Z8774 Personal history of (corrected) congenital malformations of heart and circulatory system: Secondary | ICD-10-CM

## 2020-07-30 DIAGNOSIS — I10 Essential (primary) hypertension: Secondary | ICD-10-CM

## 2020-07-30 LAB — ECHOCARDIOGRAM COMPLETE
AR max vel: 0.48 cm2
AV Area VTI: 0.41 cm2
AV Area mean vel: 0.45 cm2
AV Mean grad: 83 mmHg
AV Peak grad: 122.3 mmHg
Ao pk vel: 5.53 m/s
Area-P 1/2: 9.14 cm2
Calc EF: 53.8 %
S' Lateral: 2.1 cm
Single Plane A2C EF: 51.8 %
Single Plane A4C EF: 55.2 %

## 2020-07-30 NOTE — Patient Instructions (Signed)
Medication Instructions:  Your physician recommends that you continue on your current medications as directed. Please refer to the Current Medication list given to you today.   *If you need a refill on your cardiac medications before your next appointment, please call your pharmacy*   Lab Work: Your physician recommends that you return for lab work in: TODAY CBC, BMP If you have labs (blood work) drawn today and your tests are completely normal, you will receive your results only by: Marland Kitchen MyChart Message (if you have MyChart) OR . A paper copy in the mail If you have any lab test that is abnormal or we need to change your treatment, we will call you to review the results.   Testing/Procedures:    [facility] [place] [address] [place] [address] Dept: [phone] Loc: [phone]  Gwenna Fuston  07/30/2020  You are scheduled for a Cardiac Catheterization on Wednesday, June 8 with Dr. Verne Carrow.  1. Please arrive at the [facility] (Main Entrance A) at [facility]: [address] [place], [place] [address] at 6:30 AM (This time is two hours before your procedure to ensure your preparation). Free valet parking service is available.   Special note: Every effort is made to have your procedure done on time. Please understand that emergencies sometimes delay scheduled procedures.  2. Diet: Do not eat solid foods after midnight.  The patient may have clear liquids until 5am upon the day of the procedure.  3. Labs: YOU HAD YOUR LABS DRAWN TODAY 07/30/2020  4. Medication instructions in preparation for your procedure:   Contrast Allergy: No  5. Plan for one night stay--bring personal belongings. 6. Bring a current list of your medications and current insurance cards. 7. You MUST have a responsible person to drive you home. 8. Someone MUST be with you the first 24 hours after you arrive  home or your discharge will be delayed. 9. Please wear clothes that are easy to get on and off and wear slip-on shoes.  Thank you for allowing [place] to care for you!   -- [facility]    Follow-Up: At [facility], you and your health needs are our priority.  As part of our continuing mission to provide you with exceptional heart care, we have created designated Provider Care Teams.  These Care Teams include your primary Cardiologist (physician) and Advanced Practice Providers (APPs -  Physician Assistants and Nurse Practitioners) who all work together to provide you with the care you need, when you need it.  We recommend signing up for the patient portal called "MyChart".  Sign up information is provided on this After Visit Summary.  MyChart is used to connect with patients for Virtual Visits (Telemedicine).  Patients are able to view lab/test results, encounter notes, upcoming appointments, etc.  Non-urgent messages can be sent to your provider as well.   To learn more about what you can do with MyChart, go to [email or web address].    Your next appointment:   6 week(s)  The format for your next appointment:   In Person  Provider:   Norman Herrlich, MD   Other Instructions

## 2020-07-30 NOTE — H&P (View-Only) (Signed)
Cardiology Office Note:    Date:  07/30/2020   ID:  Stephanie Rosario, DOB 1941-02-07, MRN [id number]  PCP:  Stephanie Ponto, MD  Cardiologist:  Stephanie Herrlich, MD    Referring MD: Stephanie Ponto, MD    ASSESSMENT:    1. Aortic valve stenosis, etiology of cardiac valve disease unspecified    PLAN:    In order of problems listed above:  1. She has symptomatic critical aortic stenosis and was up brought as a work in after echocardiogram.  She is at high risk of adverse outcome and will be scheduled in the next week to undergo left and right heart catheterization in anticipation of intervention.  She has had no mucosal bleeding and no recurrent symptoms of chest pain or near syncope with activity.  She has no dye allergy or kidney disease. 2. She has a history of corrected coarctation of the aorta [facility] age 87.  She will need full imaging of her aorta if TAVR is considered   Next appointment: With me 1 month.  I will handoff to the structural heart team   Medication Adjustments/Labs and Tests Ordered: Current medicines are reviewed at length with the patient today.  Concerns regarding medicines are outlined above.  Orders Placed This Encounter  Procedures  . Basic metabolic panel  . CBC   No orders of the defined types were placed in this encounter.   Chief Complaint  Patient presents with  . Follow-up    Her echocardiogram shows critical aortic stenosis preserved left ventricular systolic function     History of Present Illness:    Stephanie Rosario is a 80 y.o. female with a hx of surgical correction coarctation of the aorta at age 53 and recent exertional chest pain shortness of breath and near exertional syncope last seen 07/24/2020.  Her physical examination suggested either severe obstructive hypertrophic cardiomyopathy or severe aortic stenosis.  She had worked in today echocardiogram shows critical AS with a peak velocity of greater than 5 m/s squared.  Fortunately although she  is short of breath with more than usual activity she has had no recurrent chest pain or syncope has abstain from vigorous physical activities. She was brought as a work in patient to be seen today I reviewed the echo findings her symptoms and the concern with critical aortic stenosis advised her to undergo left and right heart catheterization for consideration of intervention TAVR or surgical.  Benefits risks and options were detailed she agrees and wants to do this quickly and should be worked into the schedule next week. . Compliance with diet, lifestyle and medications: Yes Past Medical History:  Diagnosis Date  . Benign essential hypertension   . Benign paroxysmal positional vertigo   . History of nephrolithiasis   . Nuclear sclerotic cataract of both eyes 12/30/2013  . Primary open angle glaucoma of both eyes, severe stage 12/30/2013  . Pure hypercholesterolemia   . Sleep apnea    CPAP    Past Surgical History:  Procedure Laterality Date  . BREAST BIOPSY     Benign  . COARCTATION OF AORTA REPAIR     Age 53    Current Medications: Current Meds  Medication Sig  . bisoprolol-hydrochlorothiazide (ZIAC) 10-6.25 MG tablet Take 0.5 tablets by mouth daily.  . brimonidine (ALPHAGAN) 0.2 % ophthalmic solution Place 1 drop into both eyes 3 (three) times daily.  . dorzolamide (TRUSOPT) 2 % ophthalmic solution Place 1 drop into both eyes 3 (three) times daily.  Marland Kitchen  fluticasone (FLONASE) 50 MCG/ACT nasal spray Place 2 sprays into both nostrils as needed for congestion.  . Latanoprostene Bunod 0.024 % SOLN Place 1 drop into both eyes at bedtime.  . Netarsudil Dimesylate (RHOPRESSA) 0.02 % SOLN Place 1 drop into both eyes at bedtime.  . ramipril (ALTACE) 5 MG capsule Take 5 mg by mouth daily.  . triamterene-hydrochlorothiazide (MAXZIDE-25) 37.5-25 MG tablet Take 0.5 tablets by mouth daily.     Allergies:   Sulfa antibiotics   Social History   Socioeconomic History  . Marital status:  Unknown    Spouse name: Not on file  . Number of children: Not on file  . Years of education: Not on file  . Highest education level: Not on file  Occupational History  . Not on file  Tobacco Use  . Smoking status: Never Smoker  . Smokeless tobacco: Never Used  Substance and Sexual Activity  . Alcohol use: Yes    Alcohol/week: 1.0 standard drink    Types: 1 Glasses of wine per week    Comment: Rare  . Drug use: Never  . Sexual activity: Not on file  Other Topics Concern  . Not on file  Social History Narrative  . Not on file   Social Determinants of Health   Financial Resource Strain: Not on file  Food Insecurity: Not on file  Transportation Needs: Not on file  Physical Activity: Not on file  Stress: Not on file  Social Connections: Not on file     Family History: The patient's family history includes Congestive Heart Failure in her father; Heart attack in her mother; Hypertension in her sister. ROS:   Please see the history of present illness.    All other systems reviewed and are negative.  EKGs/Labs/Other Studies Reviewed:    The following studies were reviewed today:  EKG:  EKG last visit 07/24/2020 showed sinus rhythm pattern and marked LVH and repolarization versus preceding inferior and septal MI.  Recent labs 04/23/2020 CBC with a hemoglobin of 12.8 platelets 107,000 BUN elevated 42 creatinine elevated at 1.3 GFR reduced 40 cc/min stage III CKD cholesterol 181 LDL 99 triglycerides 189 HDL 44    Physical Exam:    VS:  BP (!) 142/70   Pulse 86   Ht 5' 2" (1.575 m)   Wt 149 lb (67.6 kg)   SpO2 98%   BMI 27.25 kg/m     Wt Readings from Last 3 Encounters:  07/30/20 149 lb (67.6 kg)  07/24/20 149 lb 3.2 oz (67.7 kg)  07/20/20 158 lb (71.7 kg)     GEN: She looks younger than her age healthy does not look chronically ill she has no pallor of the skin or membranes well nourished, well developed in no acute distress HEENT: Normal NECK: No JVD; No carotid  bruits LYMPHATICS: No lymphadenopathy CARDIAC: Soft S1 single S2 harsh 4 of 6 outflow murmur encompassing the second heart sound which radiates to the carotids with diminished carotid upstroke.  RRR, no aortic regurgitation, rubs, gallops RESPIRATORY:  Clear to auscultation without rales, wheezing or rhonchi  ABDOMEN: Soft, non-tender, non-distended MUSCULOSKELETAL:  No edema; No deformity  SKIN: Warm and dry NEUROLOGIC:  Alert and oriented x 3 PSYCHIATRIC:  Normal affect    Signed, Ayan Heffington, MD  07/30/2020 4:30 PM     [facility]  

## 2020-07-30 NOTE — Progress Notes (Signed)
Complete echocardiogram performed.  Jimmy Vada Yellen RDCS, RVT  

## 2020-07-30 NOTE — Progress Notes (Signed)
Cardiology Office Note:    Date:  07/30/2020   ID:  Stephanie Rosario, DOB 1941-02-07, MRN [id number]  PCP:  Stephanie Ponto, MD  Cardiologist:  Stephanie Herrlich, MD    Referring MD: Stephanie Ponto, MD    ASSESSMENT:    1. Aortic valve stenosis, etiology of cardiac valve disease unspecified    PLAN:    In order of problems listed above:  1. She has symptomatic critical aortic stenosis and was up brought as a work in after echocardiogram.  She is at high risk of adverse outcome and will be scheduled in the next week to undergo left and right heart catheterization in anticipation of intervention.  She has had no mucosal bleeding and no recurrent symptoms of chest pain or near syncope with activity.  She has no dye allergy or kidney disease. 2. She has a history of corrected coarctation of the aorta Duke age 87.  She will need full imaging of her aorta if TAVR is considered   Next appointment: With me 1 month.  I will handoff to the structural heart team   Medication Adjustments/Labs and Tests Ordered: Current medicines are reviewed at length with the patient today.  Concerns regarding medicines are outlined above.  Orders Placed This Encounter  Procedures  . Basic metabolic panel  . CBC   No orders of the defined types were placed in this encounter.   Chief Complaint  Patient presents with  . Follow-up    Her echocardiogram shows critical aortic stenosis preserved left ventricular systolic function     History of Present Illness:    Stephanie Rosario is a 80 y.o. female with a hx of surgical correction coarctation of the aorta at age 53 and recent exertional chest pain shortness of breath and near exertional syncope last seen 07/24/2020.  Her physical examination suggested either severe obstructive hypertrophic cardiomyopathy or severe aortic stenosis.  She had worked in today echocardiogram shows critical AS with a peak velocity of greater than 5 m/s squared.  Fortunately although she  is short of breath with more than usual activity she has had no recurrent chest pain or syncope has abstain from vigorous physical activities. She was brought as a work in patient to be seen today I reviewed the echo findings her symptoms and the concern with critical aortic stenosis advised her to undergo left and right heart catheterization for consideration of intervention TAVR or surgical.  Benefits risks and options were detailed she agrees and wants to do this quickly and should be worked into the schedule next week. . Compliance with diet, lifestyle and medications: Yes Past Medical History:  Diagnosis Date  . Benign essential hypertension   . Benign paroxysmal positional vertigo   . History of nephrolithiasis   . Nuclear sclerotic cataract of both eyes 12/30/2013  . Primary open angle glaucoma of both eyes, severe stage 12/30/2013  . Pure hypercholesterolemia   . Sleep apnea    CPAP    Past Surgical History:  Procedure Laterality Date  . BREAST BIOPSY     Benign  . COARCTATION OF AORTA REPAIR     Age 53    Current Medications: Current Meds  Medication Sig  . bisoprolol-hydrochlorothiazide (ZIAC) 10-6.25 MG tablet Take 0.5 tablets by mouth daily.  . brimonidine (ALPHAGAN) 0.2 % ophthalmic solution Place 1 drop into both eyes 3 (three) times daily.  . dorzolamide (TRUSOPT) 2 % ophthalmic solution Place 1 drop into both eyes 3 (three) times daily.  Marland Kitchen  fluticasone (FLONASE) 50 MCG/ACT nasal spray Place 2 sprays into both nostrils as needed for congestion.  . Latanoprostene Bunod 0.024 % SOLN Place 1 drop into both eyes at bedtime.  Heywood Iles Dimesylate (RHOPRESSA) 0.02 % SOLN Place 1 drop into both eyes at bedtime.  . ramipril (ALTACE) 5 MG capsule Take 5 mg by mouth daily.  Marland Kitchen triamterene-hydrochlorothiazide (MAXZIDE-25) 37.5-25 MG tablet Take 0.5 tablets by mouth daily.     Allergies:   Sulfa antibiotics   Social History   Socioeconomic History  . Marital status:  Unknown    Spouse name: Not on file  . Number of children: Not on file  . Years of education: Not on file  . Highest education level: Not on file  Occupational History  . Not on file  Tobacco Use  . Smoking status: Never Smoker  . Smokeless tobacco: Never Used  Substance and Sexual Activity  . Alcohol use: Yes    Alcohol/week: 1.0 standard drink    Types: 1 Glasses of wine per week    Comment: Rare  . Drug use: Never  . Sexual activity: Not on file  Other Topics Concern  . Not on file  Social History Narrative  . Not on file   Social Determinants of Health   Financial Resource Strain: Not on file  Food Insecurity: Not on file  Transportation Needs: Not on file  Physical Activity: Not on file  Stress: Not on file  Social Connections: Not on file     Family History: The patient's family history includes Congestive Heart Failure in her father; Heart attack in her mother; Hypertension in her sister. ROS:   Please see the history of present illness.    All other systems reviewed and are negative.  EKGs/Labs/Other Studies Reviewed:    The following studies were reviewed today:  EKG:  EKG last visit 07/24/2020 showed sinus rhythm pattern and marked LVH and repolarization versus preceding inferior and septal MI.  Recent labs 04/23/2020 CBC with a hemoglobin of 12.8 platelets 107,000 BUN elevated 42 creatinine elevated at 1.3 GFR reduced 40 cc/min stage III CKD cholesterol 181 LDL 99 triglycerides 189 HDL 44    Physical Exam:    VS:  BP (!) 142/70   Pulse 86   Ht 5\' 2"  (1.575 m)   Wt 149 lb (67.6 kg)   SpO2 98%   BMI 27.25 kg/m     Wt Readings from Last 3 Encounters:  07/30/20 149 lb (67.6 kg)  07/24/20 149 lb 3.2 oz (67.7 kg)  07/20/20 158 lb (71.7 kg)     GEN: She looks younger than her age healthy does not look chronically ill she has no pallor of the skin or membranes well nourished, well developed in no acute distress HEENT: Normal NECK: No JVD; No carotid  bruits LYMPHATICS: No lymphadenopathy CARDIAC: Soft S1 single S2 harsh 4 of 6 outflow murmur encompassing the second heart sound which radiates to the carotids with diminished carotid upstroke.  RRR, no aortic regurgitation, rubs, gallops RESPIRATORY:  Clear to auscultation without rales, wheezing or rhonchi  ABDOMEN: Soft, non-tender, non-distended MUSCULOSKELETAL:  No edema; No deformity  SKIN: Warm and dry NEUROLOGIC:  Alert and oriented x 3 PSYCHIATRIC:  Normal affect    Signed, 07/22/20, MD  07/30/2020 4:30 PM    [facility]

## 2020-07-31 ENCOUNTER — Telehealth: Payer: Self-pay

## 2020-07-31 LAB — BASIC METABOLIC PANEL
BUN/Creatinine Ratio: 37 — ABNORMAL HIGH (ref 12–28)
BUN: 42 mg/dL — ABNORMAL HIGH (ref 8–27)
CO2: 20 mmol/L (ref 20–29)
Calcium: 10.3 mg/dL (ref 8.7–10.3)
Chloride: 105 mmol/L (ref 96–106)
Creatinine, Ser: 1.14 mg/dL — ABNORMAL HIGH (ref 0.57–1.00)
Glucose: 111 mg/dL — ABNORMAL HIGH (ref 65–99)
Potassium: 4.5 mmol/L (ref 3.5–5.2)
Sodium: 144 mmol/L (ref 134–144)
eGFR: 49 mL/min/{1.73_m2} — ABNORMAL LOW (ref >59)

## 2020-07-31 LAB — CBC
Hematocrit: 39.5 % (ref 34.0–46.6)
Hemoglobin: 13.1 g/dL (ref 11.1–15.9)
MCH: 31.1 pg (ref 26.6–33.0)
MCHC: 33.2 g/dL (ref 31.5–35.7)
MCV: 94 fL (ref 79–97)
Platelets: 235 10*3/uL (ref 150–450)
RBC: 4.21 x10E6/uL (ref 3.77–5.28)
RDW: 12.2 % (ref 11.7–15.4)
WBC: 9.9 10*3/uL (ref 3.4–10.8)

## 2020-07-31 NOTE — Telephone Encounter (Signed)
Left message on patients voicemail to please return our call.   

## 2020-07-31 NOTE — Telephone Encounter (Signed)
Patient is returning call.  °

## 2020-07-31 NOTE — Telephone Encounter (Signed)
Patient notified of results.

## 2020-07-31 NOTE — Telephone Encounter (Signed)
-----   Message from Baldo Daub, MD sent at 07/31/2020  8:08 AM EDT ----- Good results before heart catheterization

## 2020-08-04 ENCOUNTER — Telehealth: Payer: Self-pay | Admitting: *Deleted

## 2020-08-04 NOTE — Telephone Encounter (Signed)
Pt contacted pre-catheterization scheduled at [facility] for: Wednesday August 04, 2020 8:30 AM Verified arrival time and place: [facility] Main Entrance [facility]) at: 6:30 AM   No solid food after midnight prior to cath, clear liquids until 5 AM day of procedure.  Hold: Altace -AM of procedure-GFR 49 Triamterene/HCT-AM of procedure-GFR 49 Bisoprolol/HCT-AM of procedure-GFR 49  Except hold medications AM meds can be  taken pre-cath with sips of water including: ASA 81 mg   Confirmed patient has responsible adult to drive home post procedure and be with patient first 24 hours after arriving home: yes  You are allowed ONE visitor in the waiting room during the time you are at the hospital for your procedure. Both you and your visitor must wear a mask once you enter the hospital.   Patient reports does not currently have any symptoms concerning for COVID-19 and no household members with COVID-19 like illness.   Reviewed procedure/mask/visitor instructions with patient.

## 2020-08-05 ENCOUNTER — Telehealth: Payer: Self-pay

## 2020-08-05 ENCOUNTER — Other Ambulatory Visit: Payer: Self-pay

## 2020-08-05 ENCOUNTER — Encounter (HOSPITAL_COMMUNITY): Payer: Self-pay | Admitting: Cardiovascular Disease

## 2020-08-05 ENCOUNTER — Encounter (HOSPITAL_COMMUNITY)
Admission: RE | Disposition: A | Discharge status: Home / Self Care | Payer: Self-pay | Source: Home / Self Care | Attending: Cardiovascular Disease

## 2020-08-05 ENCOUNTER — Ambulatory Visit (HOSPITAL_COMMUNITY)
Admission: RE | Admit: 2020-08-05 | Discharge: 2020-08-05 | Disposition: A | Discharge status: Home / Self Care | Payer: Medicare Other | Attending: Cardiovascular Disease | Admitting: Cardiovascular Disease

## 2020-08-05 DIAGNOSIS — I350 Nonrheumatic aortic (valve) stenosis: Secondary | ICD-10-CM

## 2020-08-05 DIAGNOSIS — I2510 Atherosclerotic heart disease of native coronary artery without angina pectoris: Secondary | ICD-10-CM | POA: Insufficient documentation

## 2020-08-05 DIAGNOSIS — Z882 Allergy status to sulfonamides status: Secondary | ICD-10-CM | POA: Insufficient documentation

## 2020-08-05 DIAGNOSIS — Z8249 Family history of ischemic heart disease and other diseases of the circulatory system: Secondary | ICD-10-CM | POA: Diagnosis not present

## 2020-08-05 DIAGNOSIS — Z79899 Other long term (current) drug therapy: Secondary | ICD-10-CM | POA: Diagnosis not present

## 2020-08-05 HISTORY — PX: RIGHT/LEFT HEART CATH AND CORONARY ANGIOGRAPHY: CATH118266

## 2020-08-05 LAB — POCT I-STAT EG7
Acid-base deficit: 6 mmol/L — ABNORMAL HIGH (ref 0.0–2.0)
Bicarbonate: 22 mmol/L (ref 20.0–28.0)
Calcium, Ion: 1.36 mmol/L (ref 1.15–1.40)
HCT: 34 % — ABNORMAL LOW (ref 36.0–46.0)
Hemoglobin: 11.6 g/dL — ABNORMAL LOW (ref 12.0–15.0)
O2 Saturation: 71 %
Potassium: 3.9 mmol/L (ref 3.5–5.1)
Sodium: 142 mmol/L (ref 135–145)
TCO2: 24 mmol/L (ref 22–32)
pCO2, Ven: 52.3 mmHg (ref 44.0–60.0)
pH, Ven: 7.233 — ABNORMAL LOW (ref 7.250–7.430)
pO2, Ven: 44 mmHg (ref 32.0–45.0)

## 2020-08-05 LAB — POCT I-STAT 7, (LYTES, BLD GAS, ICA,H+H)
Acid-base deficit: 5 mmol/L — ABNORMAL HIGH (ref 0.0–2.0)
Bicarbonate: 20.8 mmol/L (ref 20.0–28.0)
Calcium, Ion: 1.23 mmol/L (ref 1.15–1.40)
HCT: 32 % — ABNORMAL LOW (ref 36.0–46.0)
Hemoglobin: 10.9 g/dL — ABNORMAL LOW (ref 12.0–15.0)
O2 Saturation: 100 %
Potassium: 3.7 mmol/L (ref 3.5–5.1)
Sodium: 143 mmol/L (ref 135–145)
TCO2: 22 mmol/L (ref 22–32)
pCO2 arterial: 43 mmHg (ref 32.0–48.0)
pH, Arterial: 7.293 — ABNORMAL LOW (ref 7.350–7.450)
pO2, Arterial: 187 mmHg — ABNORMAL HIGH (ref 83.0–108.0)

## 2020-08-05 LAB — POCT ACTIVATED CLOTTING TIME
Activated Clotting Time: 167 seconds
Activated Clotting Time: 190 seconds

## 2020-08-05 SURGERY — RIGHT/LEFT HEART CATH AND CORONARY ANGIOGRAPHY
Anesthesia: LOCAL

## 2020-08-05 MED ORDER — SODIUM CHLORIDE 0.9% FLUSH: 3.0000 mL | Freq: Two times a day (BID) | INTRAVENOUS | Status: DC

## 2020-08-05 MED ORDER — ASPIRIN 81 MG PO CHEW: 81.0000 mg | CHEWABLE_TABLET | ORAL | Status: DC

## 2020-08-05 MED ORDER — HEPARIN SODIUM (PORCINE) 1000 UNIT/ML IJ SOLN
INTRAMUSCULAR | Status: AC
  Filled 2020-08-05: qty 1

## 2020-08-05 MED ORDER — SODIUM CHLORIDE 0.9 % IV SOLN: 250.0000 mL | INTRAVENOUS | Status: DC | PRN

## 2020-08-05 MED ORDER — SODIUM CHLORIDE 0.9 % WEIGHT BASED INFUSION: 1.0000 mL/kg/h | INTRAVENOUS | Status: DC

## 2020-08-05 MED ORDER — HEPARIN (PORCINE) IN NACL 1000-0.9 UT/500ML-% IV SOLN
INTRAVENOUS | Status: DC | PRN
  Administered 2020-08-05 (×2): 500 mL

## 2020-08-05 MED ORDER — SODIUM CHLORIDE 0.9 % WEIGHT BASED INFUSION
3.0000 mL/kg/h | INTRAVENOUS | Status: AC
  Administered 2020-08-05: 3 mL/kg/h via INTRAVENOUS

## 2020-08-05 MED ORDER — SODIUM CHLORIDE 0.9% FLUSH: 3.0000 mL | INTRAVENOUS | Status: DC | PRN

## 2020-08-05 MED ORDER — LABETALOL HCL 5 MG/ML IV SOLN: 10.0000 mg | INTRAVENOUS | Status: DC | PRN

## 2020-08-05 MED ORDER — ONDANSETRON HCL 4 MG/2ML IJ SOLN: 4.0000 mg | Freq: Four times a day (QID) | INTRAMUSCULAR | Status: DC | PRN

## 2020-08-05 MED ORDER — HEPARIN SODIUM (PORCINE) 1000 UNIT/ML IJ SOLN
INTRAMUSCULAR | Status: DC | PRN
  Administered 2020-08-05: 3500 [IU] via INTRAVENOUS

## 2020-08-05 MED ORDER — VERAPAMIL HCL 2.5 MG/ML IV SOLN
INTRAVENOUS | Status: AC
  Filled 2020-08-05: qty 2

## 2020-08-05 MED ORDER — IOHEXOL 350 MG/ML SOLN
INTRAVENOUS | Status: DC | PRN
  Administered 2020-08-05: 40 mL

## 2020-08-05 MED ORDER — FENTANYL CITRATE (PF) 100 MCG/2ML IJ SOLN
INTRAMUSCULAR | Status: AC
  Filled 2020-08-05: qty 2

## 2020-08-05 MED ORDER — LIDOCAINE HCL (PF) 1 % IJ SOLN
INTRAMUSCULAR | Status: DC | PRN
  Administered 2020-08-05: 20 mL
  Administered 2020-08-05 (×2): 2 mL

## 2020-08-05 MED ORDER — LIDOCAINE HCL (PF) 1 % IJ SOLN
INTRAMUSCULAR | Status: AC
  Filled 2020-08-05: qty 30

## 2020-08-05 MED ORDER — FENTANYL CITRATE (PF) 100 MCG/2ML IJ SOLN
INTRAMUSCULAR | Status: DC | PRN
  Administered 2020-08-05 (×2): 25 ug via INTRAVENOUS

## 2020-08-05 MED ORDER — MIDAZOLAM HCL 2 MG/2ML IJ SOLN
INTRAMUSCULAR | Status: AC
  Filled 2020-08-05: qty 2

## 2020-08-05 MED ORDER — MIDAZOLAM HCL 2 MG/2ML IJ SOLN
INTRAMUSCULAR | Status: DC | PRN
  Administered 2020-08-05 (×2): 1 mg via INTRAVENOUS

## 2020-08-05 MED ORDER — HYDRALAZINE HCL 20 MG/ML IJ SOLN: 10.0000 mg | INTRAMUSCULAR | Status: DC | PRN

## 2020-08-05 MED ORDER — ACETAMINOPHEN 325 MG PO TABS: 650.0000 mg | ORAL_TABLET | ORAL | Status: DC | PRN

## 2020-08-05 MED ORDER — VERAPAMIL HCL 2.5 MG/ML IV SOLN
INTRAVENOUS | Status: DC | PRN
  Administered 2020-08-05: 10 mL via INTRA_ARTERIAL

## 2020-08-05 MED ORDER — HEPARIN (PORCINE) IN NACL 1000-0.9 UT/500ML-% IV SOLN
INTRAVENOUS | Status: AC
  Filled 2020-08-05: qty 1000

## 2020-08-05 MED ORDER — SODIUM CHLORIDE 0.9 % IV SOLN: INTRAVENOUS | Status: AC

## 2020-08-05 SURGICAL SUPPLY — 15 items
CATH 5FR JL3.5 JR4 ANG PIG MP (CATHETERS) ×2 IMPLANT
CATH INFINITI 5FR AL1 (CATHETERS) ×2 IMPLANT
CATH SWAN GANZ 7F STRAIGHT (CATHETERS) ×2 IMPLANT
DEVICE RAD TR BAND REGULAR (VASCULAR PRODUCTS) ×2 IMPLANT
GLIDESHEATH SLEND SS 6F .021 (SHEATH) ×2 IMPLANT
GUIDEWIRE INQWIRE 1.5J.035X260 (WIRE) ×1 IMPLANT
INQWIRE 1.5J .035X260CM (WIRE) ×2
KIT HEART LEFT (KITS) ×2 IMPLANT
PACK CARDIAC CATHETERIZATION (CUSTOM PROCEDURE TRAY) ×2 IMPLANT
SHEATH GLIDE SLENDER 4/5FR (SHEATH) ×2 IMPLANT
SHEATH PINNACLE 7F 10CM (SHEATH) ×2 IMPLANT
SHEATH PROBE COVER 6X72 (BAG) ×2 IMPLANT
TRANSDUCER W/STOPCOCK (MISCELLANEOUS) ×2 IMPLANT
TUBING CIL FLEX 10 FLL-RA (TUBING) ×2 IMPLANT
WIRE EMERALD ST .035X150CM (WIRE) ×2 IMPLANT

## 2020-08-05 NOTE — Progress Notes (Addendum)
SITE AREA: right groin/femoral  SITE PRIOR TO REMOVAL:  LEVEL 1  PRESSURE APPLIED FOR: approximately 25 minutes  MANUAL: yes  PATIENT STATUS DURING PULL: bp decreased, asymptomatic, NS IV bolus given, bed in trendelenburg position  POST PULL SITE:  LEVEL  0  POST PULL INSTRUCTIONS GIVEN: yes  POST PULL PULSES PRESENT: bilateral pedal pulses at +2  DRESSING APPLIED: gauze with tegaderm  BEDREST BEGINS @ 1100  COMMENTS:

## 2020-08-05 NOTE — Progress Notes (Signed)
Pre TAVR CT orders placed.

## 2020-08-05 NOTE — Interval H&P Note (Signed)
History and Physical Interval Note:  08/05/2020 7:31 AM  Stephanie Rosario  has presented today for surgery, with the diagnosis of symptomatic critical aortic stenosis.  The various methods of treatment have been discussed with the patient and family. After consideration of risks, benefits and other options for treatment, the patient has consented to  Procedure(s): RIGHT/LEFT HEART CATH AND CORONARY ANGIOGRAPHY (N/A) as a surgical intervention.  The patient's history has been reviewed, patient examined, no change in status, stable for surgery.  I have reviewed the patient's chart and labs.  Questions were answered to the patient's satisfaction.    Cath Lab Visit (complete for each Cath Lab visit)  Clinical Evaluation Leading to the Procedure:   ACS: No.  Non-ACS:    Anginal Classification: CCS II  Anti-ischemic medical therapy: Minimal Therapy (1 class of medications)  Non-Invasive Test Results: No non-invasive testing performed  Prior CABG: No previous CABG        Verne Carrow

## 2020-08-05 NOTE — Discharge Instructions (Signed)
Femoral Site Care  This sheet gives you information about how to care for yourself after your procedure. Your health care provider may also give you more specific instructions. If you have problems or questions, contact your health care provider. What can I expect after the procedure? After the procedure, it is common to have:  Bruising that usually fades within 1-2 weeks.  Tenderness at the site. Follow these instructions at home: Wound care  Follow instructions from your health care provider about how to take care of your insertion site. Make sure you: ? Wash your hands with soap and water before you change your bandage (dressing). If soap and water are not available, use hand sanitizer. ? Change your dressing as told by your health care provider. ? Leave stitches (sutures), skin glue, or adhesive strips in place. These skin closures may need to stay in place for 2 weeks or longer. If adhesive strip edges start to loosen and curl up, you may trim the loose edges. Do not remove adhesive strips completely unless your health care provider tells you to do that.  Do not take baths, swim, or use a hot tub until your health care provider approves.  You may shower 24-48 hours after the procedure or as told by your health care provider. ? Gently wash the site with plain soap and water. ? Pat the area dry with a clean towel. ? Do not rub the site. This may cause bleeding.  Do not apply powder or lotion to the site. Keep the site clean and dry.  Check your femoral site every day for signs of infection. Check for: ? Redness, swelling, or pain. ? Fluid or blood. ? Warmth. ? Pus or a bad smell. Activity  For the first 2-3 days after your procedure, or as long as directed: ? Avoid climbing stairs as much as possible. ? Do not squat.  Do not lift anything that is heavier than 10 lb (4.5 kg), or the limit that you are told, until your health care provider says that it is safe.  Rest as  directed. ? Avoid sitting for a long time without moving. Get up to take short walks every 1-2 hours.  Do not drive for 24 hours if you were given a medicine to help you relax (sedative). General instructions  Take over-the-counter and prescription medicines only as told by your health care provider.  Keep all follow-up visits as told by your health care provider. This is important. Contact a health care provider if you have:  A fever or chills.  You have redness, swelling, or pain around your insertion site. Get help right away if:  The catheter insertion area swells very fast.  You pass out.  You suddenly start to sweat or your skin gets clammy.  The catheter insertion area is bleeding, and the bleeding does not stop when you hold steady pressure on the area.  The area near or just beyond the catheter insertion site becomes pale, cool, tingly, or numb. These symptoms may represent a serious problem that is an emergency. Do not wait to see if the symptoms will go away. Get medical help right away. Call your local emergency services (911 in the [place] Do not drive yourself to the hospital. Summary  After the procedure, it is common to have bruising that usually fades within 1-2 weeks.  Check your femoral site every day for signs of infection.  Do not lift anything that is heavier than 10 lb (4.5 kg), or   the limit that you are told, until your health care provider says that it is safe. This information is not intended to replace advice given to you by your health care provider. Make sure you discuss any questions you have with your health care provider. Document Revised: 10/18/2019 Document Reviewed: 10/18/2019 Elsevier Patient Education  2021 Else[employer] Radial Site Care  This sheet gives you information about how to care for yourself after your procedure. Your health care provider may also give you more specific instructions. If you have problems or questions, contact your  health care provider. What can I expect after the procedure? After the procedure, it is common to have:  Bruising and tenderness at the catheter insertion area. Follow these instructions at home: Medicines  Take over-the-counter and prescription medicines only as told by your health care provider. Insertion site care  Follow instructions from your health care provider about how to take care of your insertion site. Make sure you: ? Wash your hands with soap and water before you change your bandage (dressing). If soap and water are not available, use hand sanitizer. ? Change your dressing as told by your health care provider. ? Leave stitches (sutures), skin glue, or adhesive strips in place. These skin closures may need to stay in place for 2 weeks or longer. If adhesive strip edges start to loosen and curl up, you may trim the loose edges. Do not remove adhesive strips completely unless your health care provider tells you to do that.  Check your insertion site every day for signs of infection. Check for: ? Redness, swelling, or pain. ? Fluid or blood. ? Pus or a bad smell. ? Warmth.  Do not take baths, swim, or use a hot tub until your health care provider approves.  You may shower 24-48 hours after the procedure, or as directed by your health care provider. ? Remove the dressing and gently wash the site with plain soap and water. ? Pat the area dry with a clean towel. ? Do not rub the site. That could cause bleeding.  Do not apply powder or lotion to the site. Activity  For 24 hours after the procedure, or as directed by your health care provider: ? Do not flex or bend the affected arm. ? Do not push or pull heavy objects with the affected arm. ? Do not drive yourself home from the hospital or clinic. You may drive 24 hours after the procedure unless your health care provider tells you not to. ? Do not operate machinery or power tools.  Do not lift anything that is heavier than  10 lb (4.5 kg), or the limit that you are told, until your health care provider says that it is safe.  Ask your health care provider when it is okay to: ? Return to work or school. ? Resume usual physical activities or sports. ? Resume sexual activity.   General instructions  If the catheter site starts to bleed, raise your arm and put firm pressure on the site. If the bleeding does not stop, get help right away. This is a medical emergency.  If you went home on the same day as your procedure, a responsible adult should be with you for the first 24 hours after you arrive home.  Keep all follow-up visits as told by your health care provider. This is important. Contact a health care provider if:  You have a fever.  You have redness, swelling, or yellow drainage around your insertion site.   Get help right away if:  You have unusual pain at the radial site.  The catheter insertion area swells very fast.  The insertion area is bleeding, and the bleeding does not stop when you hold steady pressure on the area.  Your arm or hand becomes pale, cool, tingly, or numb. These symptoms may represent a serious problem that is an emergency. Do not wait to see if the symptoms will go away. Get medical help right away. Call your local emergency services (911 in the [place] Do not drive yourself to the hospital. Summary  After the procedure, it is common to have bruising and tenderness at the site.  Follow instructions from your health care provider about how to take care of your radial site wound. Check the wound every day for signs of infection.  Do not lift anything that is heavier than 10 lb (4.5 kg), or the limit that you are told, until your health care provider says that it is safe. This information is not intended to replace advice given to you by your health care provider. Make sure you discuss any questions you have with your health care provider. Document Revised: 03/22/2017 Document  Reviewed: 03/22/2017 Elsevier Patient Education  2021 Elsevier Inc.  

## 2020-08-05 NOTE — Telephone Encounter (Signed)
  [facility]   MULTIDISCIPLINARY HEART VALVE TEAM  K[facility]Q completed with pt in the cath lab holding area.    I spoke with Dr Clifton James in regards to pre TAVR PT evaluation and at this time we will not perform.  Pt has critical AS and had pre syncope when walking to her car.  The pt develops symptoms with minimal activity and from a safety stand point we do not want the pt to over exert herself.

## 2020-08-12 ENCOUNTER — Other Ambulatory Visit: Payer: Self-pay

## 2020-08-12 ENCOUNTER — Encounter (HOSPITAL_COMMUNITY): Payer: Medicare Other

## 2020-08-12 ENCOUNTER — Ambulatory Visit (HOSPITAL_COMMUNITY)
Admission: RE | Admit: 2020-08-12 | Discharge: 2020-08-12 | Disposition: A | Discharge status: Home / Self Care | Payer: Medicare Other | Source: Ambulatory Visit | Attending: Cardiovascular Disease | Admitting: Cardiovascular Disease

## 2020-08-12 ENCOUNTER — Ambulatory Visit (HOSPITAL_BASED_OUTPATIENT_CLINIC_OR_DEPARTMENT_OTHER)
Admission: RE | Admit: 2020-08-12 | Discharge: 2020-08-12 | Disposition: A | Discharge status: Home / Self Care | Payer: Medicare Other | Source: Ambulatory Visit | Attending: Cardiovascular Disease | Admitting: Cardiovascular Disease

## 2020-08-12 ENCOUNTER — Ambulatory Visit (HOSPITAL_COMMUNITY): Payer: Medicare Other

## 2020-08-12 ENCOUNTER — Encounter (HOSPITAL_COMMUNITY): Payer: Self-pay

## 2020-08-12 DIAGNOSIS — I350 Nonrheumatic aortic (valve) stenosis: Secondary | ICD-10-CM

## 2020-08-12 DIAGNOSIS — N200 Calculus of kidney: Secondary | ICD-10-CM | POA: Diagnosis not present

## 2020-08-12 DIAGNOSIS — I2510 Atherosclerotic heart disease of native coronary artery without angina pectoris: Secondary | ICD-10-CM | POA: Diagnosis not present

## 2020-08-12 DIAGNOSIS — K760 Fatty (change of) liver, not elsewhere classified: Secondary | ICD-10-CM | POA: Diagnosis not present

## 2020-08-12 DIAGNOSIS — I712 Thoracic aortic aneurysm, without rupture: Secondary | ICD-10-CM | POA: Diagnosis not present

## 2020-08-12 DIAGNOSIS — Z01818 Encounter for other preprocedural examination: Secondary | ICD-10-CM | POA: Diagnosis not present

## 2020-08-12 DIAGNOSIS — K5730 Diverticulosis of large intestine without perforation or abscess without bleeding: Secondary | ICD-10-CM | POA: Diagnosis not present

## 2020-08-12 DIAGNOSIS — H401133 Primary open-angle glaucoma, bilateral, severe stage: Secondary | ICD-10-CM | POA: Diagnosis not present

## 2020-08-12 MED ORDER — IOHEXOL 350 MG/ML SOLN
95.0000 mL | Freq: Once | INTRAVENOUS | Status: AC | PRN
  Administered 2020-08-12: 95 mL via INTRAVENOUS

## 2020-08-12 MED ORDER — NITROGLYCERIN 0.4 MG SL SUBL: 0.8000 mg | SUBLINGUAL_TABLET | Freq: Once | SUBLINGUAL | Status: DC

## 2020-08-12 NOTE — Progress Notes (Signed)
Carotid artery duplex completed. Refer to "CV Proc" under chart review to view preliminary results.  08/12/2020 1:06 PM Eula Fried., MHA, RVT, RDCS, RDMS

## 2020-08-14 ENCOUNTER — Encounter: Payer: Self-pay | Admitting: Thoracic Surgery (Cardiothoracic Vascular Surgery)

## 2020-08-14 ENCOUNTER — Other Ambulatory Visit: Payer: Self-pay | Admitting: Physician Assistant

## 2020-08-14 ENCOUNTER — Other Ambulatory Visit: Payer: Self-pay

## 2020-08-14 ENCOUNTER — Institutional Professional Consult (permissible substitution) (INDEPENDENT_AMBULATORY_CARE_PROVIDER_SITE_OTHER): Payer: Medicare Other | Admitting: Thoracic Surgery (Cardiothoracic Vascular Surgery)

## 2020-08-14 VITALS — BP 160/80 | HR 90 | Resp 20 | Ht 62.0 in | Wt 149.0 lb

## 2020-08-14 DIAGNOSIS — I350 Nonrheumatic aortic (valve) stenosis: Secondary | ICD-10-CM

## 2020-08-14 NOTE — H&P (View-Only) (Signed)
[address]       [place] [address]             [phone]     CARDIOTHORACIC SURGERY CONSULTATION REPORT  Referring Provider is Bettina Gavia, Hilton Cork, MD PCP is Ronita Hipps, MD  Chief Complaint  Patient presents with   Aortic Stenosis    Surgical consult for TAVR, review all testing    HPI:  Patient is an 80 year old female with history of coarctation of the aorta repaired at age 77, hypertension, hyperlipidemia, and sleep apnea who has been referred for surgical consultation to discuss treatment options for management of recently discovered symptomatic critical aortic stenosis.  Patient states that she underwent uncomplicated repair of coarctation of the aorta at age 76 by Dr. Bluford Main at [facility].  She has been remarkably healthy and relatively physically active all of her adult life until recently.  Over the past 2 to 3 years she has developed progressive symptoms of exertional shortness of breath and fatigue.  Since this past Thanksgiving her symptoms have gotten much more severe such that she gets short of breath with very low level activity.  In addition she has had several episodes of near syncope that are always brought on with physical exertion.  She has had some chest tightness and chest pressure with exertion.  She has not had any resting shortness of breath or chest discomfort.  She has not had any history of orthopnea or lower extremity edema.  Patient was recently referred for cardiology consultation by her primary care physician and was initially evaluated by Dr. Bettina Gavia on Jul 24, 2020.  She was noted to have a prominent systolic murmur on physical exam and transthoracic echocardiogram revealed critical aortic stenosis with normal left ventricular systolic function.  She was promptly referred to the multidisciplinary heart valve clinic and underwent diagnostic cardiac catheterization by Dr. Angelena Form.  Catheterization revealed  nonobstructive coronary artery disease and confirmed the presence of severe aortic stenosis.  CT angiography was performed and the patient was referred for surgical consultation.  Patient is married and [place] between [place] and [place].  Up until last 2 to 3 years patient has remained quite active physically.  She enjoys hiking and kayaking with her husband.  She is now severely limited by exertional shortness of breath and chest discomfort as noted previously.  She also has history of intermittent gout but this does not affect her ambulation to any significant degree.  Appetite is good.  She has not had fevers or chills.  She denies any productive cough.  Past Medical History:  Diagnosis Date   Benign essential hypertension    Benign paroxysmal positional vertigo    History of nephrolithiasis    Nuclear sclerotic cataract of both eyes 12/30/2013   Primary open angle glaucoma of both eyes, severe stage 12/30/2013   Pure hypercholesterolemia    Sleep apnea    CPAP    Past Surgical History:  Procedure Laterality Date   BREAST BIOPSY     Benign   COARCTATION OF AORTA REPAIR     Age 20   RIGHT/LEFT HEART CATH AND CORONARY ANGIOGRAPHY N/A 08/05/2020   Procedure: RIGHT/LEFT HEART CATH AND CORONARY ANGIOGRAPHY;  Surgeon: Burnell Blanks, MD;  Location: [facility];  Service: Cardiovascular;  Laterality: N/A;    Family History  Problem Relation Age of Onset   Heart attack Mother    Congestive Heart  Failure Father    Hypertension Sister     Social History   Socioeconomic History   Marital status: Unknown    Spouse name: Not on file   Number of children: Not on file   Years of education: Not on file   Highest education level: Not on file  Occupational History   Not on file  Tobacco Use   Smoking status: Never   Smokeless tobacco: Never  Substance and Sexual Activity   Alcohol use: Yes    Alcohol/week: 1.0 standard drink    Types: 1 Glasses of  wine per week    Comment: Rare   Drug use: Never   Sexual activity: Not on file  Other Topics Concern   Not on file  Social History Narrative   Not on file   Social Determinants of Health   Financial Resource Strain: Not on file  Food Insecurity: Not on file  Transportation Needs: Not on file  Physical Activity: Not on file  Stress: Not on file  Social Connections: Not on file  Intimate Partner Violence: Not on file    Current Outpatient Medications  Medication Sig Dispense Refill   bisoprolol-hydrochlorothiazide (ZIAC) 10-6.25 MG tablet Take 0.5 tablets by mouth daily.     brimonidine (ALPHAGAN) 0.2 % ophthalmic solution Place 1 drop into both eyes 3 (three) times daily.     dorzolamide (TRUSOPT) 2 % ophthalmic solution Place 1 drop into both eyes 3 (three) times daily.     fluticasone (FLONASE) 50 MCG/ACT nasal spray Place 2 sprays into both nostrils as needed for congestion.     Latanoprostene Bunod (VYZULTA) 0.024 % SOLN Apply 1 drop to eye at bedtime.     Netarsudil Dimesylate (RHOPRESSA) 0.02 % SOLN Place 1 drop into both eyes at bedtime.     ramipril (ALTACE) 5 MG capsule Take 5 mg by mouth daily.     triamterene-hydrochlorothiazide (MAXZIDE-25) 37.5-25 MG tablet Take 0.5 tablets by mouth daily.     No current facility-administered medications for this visit.    Allergies  Allergen Reactions   Sulfa Antibiotics Rash      Review of Systems:   General:  normal appetite, decreased energy, no weight gain, no weight loss, no fever  Cardiac:  + chest pain with exertion, no chest pain at rest, +SOB with low level exertion, no resting SOB, no PND, no orthopnea, no palpitations, no arrhythmia, no atrial fibrillation, no LE edema, + dizzy spells, near syncope  Respiratory:  + exertional shortness of breath, no home oxygen, no productive cough, no dry cough, no bronchitis, no wheezing, no hemoptysis, no asthma, no pain with inspiration or cough, + sleep apnea, + CPAP at  night  GI:   no difficulty swallowing, no reflux, no frequent heartburn, no hiatal hernia, no abdominal pain, no constipation, no diarrhea, no hematochezia, no hematemesis, no melena  GU:   no dysuria,  no frequency, no urinary tract infection, no hematuria, no kidney stones, no kidney disease  Vascular:  no pain suggestive of claudication, no pain in feet, no leg cramps, no varicose veins, no DVT, no non-healing foot ulcer  Neuro:   no stroke, no TIA's, no seizures, no headaches, no temporary blindness one eye,  no slurred speech, no peripheral neuropathy, no chronic pain, no instability of gait, no memory/cognitive dysfunction  Musculoskeletal: + arthritis, no joint swelling, no myalgias, no difficulty walking, normal mobility   Skin:   no rash, no itching, no skin infections, no pressure sores or  ulcerations  Psych:   no anxiety, no depression, no nervousness, no unusual recent stress  Eyes:   no blurry vision, no floaters, no recent vision changes, + wears glasses or contacts  ENT:   no hearing loss, no loose or painful teeth, no dentures, last saw dentist within the past 6 months  Hematologic:  no easy bruising, no abnormal bleeding, no clotting disorder, no frequent epistaxis  Endocrine:  no diabetes, does not check CBG's at home     Physical Exam:   BP (!) 160/80   Pulse 90   Resp 20   Ht _0  (1.575 m)   Wt 149 lb (67.6 kg)   SpO2 95% Comment: RA  BMI 27.25 kg/m   General:    well-appearing  HEENT:  Unremarkable   Neck:   no JVD, no bruits, no adenopathy   Chest:   clear to auscultation, symmetrical breath sounds, no wheezes, no rhonchi   CV:   RRR, grade IV/VI late peaking crescendo/decrescendo systolic murmur   Abdomen:  soft, non-tender, no masses   Extremities:  warm, well-perfused, pulses diminished, no LE edema  Rectal/GU  Deferred  Neuro:   Grossly non-focal and symmetrical throughout  Skin:   Clean and dry, no rashes, no breakdown   Diagnostic Tests:  EKG: NSR  w/out significant AV conduction delay (07/24/2020)        ECHOCARDIOGRAM REPORT        Patient Name:   TACIE MCCUISTION Date of Exam: 07/30/2020  Medical Rec #:  [id number]     Height:       62.0 in  Accession #:    [id number]    Weight:       149.2 lb  Date of Birth:  Apr 10, 1940     BSA:          1.688 m  Patient Age:    75 years      BP:           138/75 mmHg  Patient Gender: F             HR:           93 bpm.  Exam Location:  [facility]   Procedure: 2D Echo and Strain Analysis   Indications:    Precordial pain [R07.2 (ICD-10-CM)]; Benign essential                  hypertension [I10 (ICD-10-CM)]     History:        Patient has no prior history of Echocardiogram  examinations.                  Signs/Symptoms:Dyspnea; Risk Factors:Dyslipidemia.     Sonographer:    Luane School  Referring Phys: [id number] McColl to: Lauree Chandler MD   IMPRESSIONS     1. Left ventricular ejection fraction, by estimation, is 65 to 70%. The  left ventricle has hyperdynamic function. The left ventricle has no  regional wall motion abnormalities. There is severe concentric left  ventricular hypertrophy. Left ventricular  diastolic parameters are consistent with Grade I diastolic dysfunction  (impaired relaxation). The average left ventricular global longitudinal  strain is -6.5 %. The global longitudinal strain is abnormal.   2. Right ventricular systolic function is normal. The right ventricular  size is normal. There is normal pulmonary artery systolic pressure.   3. The mitral valve is normal in structure. No evidence of mitral valve  regurgitation. No evidence of  mitral stenosis.   4. The aortic valve has an indeterminant number of cusps. There is severe  calcifcation of the aortic valve. Aortic valve regurgitation is not  visualized. Severe/critical aortic valve stenosis.   5. There is moderate dilatation of the ascending aorta, measuring 42 mm.   6. The inferior vena cava  is normal in size with greater than 50%  respiratory variability, suggesting right atrial pressure of 3 mmHg.   FINDINGS   Left Ventricle: Left ventricular ejection fraction, by estimation, is 65  to 70%. The left ventricle has hyperdynamic function. The left ventricle  has no regional wall motion abnormalities. The average left ventricular  global longitudinal strain is -6.5  %. The global longitudinal strain is abnormal. The left ventricular  internal cavity size was normal in size. There is severe concentric left  ventricular hypertrophy. Left ventricular diastolic parameters are  consistent with Grade I diastolic dysfunction  (impaired relaxation). Indeterminate filling pressures.   Right Ventricle: The right ventricular size is normal. No increase in  right ventricular wall thickness. Right ventricular systolic function is  normal. There is normal pulmonary artery systolic pressure. The tricuspid  regurgitant velocity is 1.29 m/s, and   with an assumed right atrial pressure of 3 mmHg, the estimated right  ventricular systolic pressure is 9.7 mmHg.   Left Atrium: Left atrial size was normal in size.   Right Atrium: Right atrial size was normal in size.   Pericardium: There is no evidence of pericardial effusion.   Mitral Valve: The mitral valve is normal in structure. No evidence of  mitral valve regurgitation. No evidence of mitral valve stenosis.   Tricuspid Valve: The tricuspid valve is normal in structure. Tricuspid  valve regurgitation is trivial. No evidence of tricuspid stenosis.   Aortic Valve: The aortic valve has an indeterminant number of cusps. There  is severe calcifcation of the aortic valve. Aortic valve regurgitation is  not visualized. Severe/critical aortic stenosis is present. Aortic valve  mean gradient measures 83.0  mmHg. Aortic valve peak gradient measures 122.3 mmHg. Aortic valve area,  by VTI measures 0.41 cm.   Pulmonic Valve: The pulmonic valve  was normal in structure. Pulmonic valve  regurgitation is not visualized. No evidence of pulmonic stenosis.   Aorta: The aortic root is normal in size and structure and the aortic arch  was not well visualized. There is moderate dilatation of the ascending  aorta, measuring 42 mm.   Venous: A normal flow pattern is recorded from the right upper pulmonary  vein. The inferior vena cava is normal in size with greater than 50%  respiratory variability, suggesting right atrial pressure of 3 mmHg.   IAS/Shunts: No atrial level shunt detected by color flow Doppler.      LEFT VENTRICLE  PLAX 2D  LVIDd:         3.30 cm     Diastology  LVIDs:         2.10 cm     LV e' medial:    3.26 cm/s  LV PW:         1.60 cm     LV E/e' medial:  30.2  LV IVS:        1.60 cm     LV e' lateral:   4.03 cm/s  LVOT diam:     2.00 cm     LV E/e' lateral: 24.4  LV SV:         62  LV SV Index:  21          2D Longitudinal Strain  LVOT Area:     3.14 cm    2D Strain GLS Avg:     -6.5 %     LV Volumes (MOD)  LV vol d, MOD A2C: 49.0 ml  LV vol d, MOD A4C: 54.7 ml  LV vol s, MOD A2C: 23.6 ml  LV vol s, MOD A4C: 24.5 ml  LV SV MOD A2C:     25.4 ml  LV SV MOD A4C:     54.7 ml  LV SV MOD BP:      28.2 ml   RIGHT VENTRICLE             IVC  RV S prime:     10.70 cm/s  IVC diam: 1.30 cm  TAPSE (M-mode): 2.0 cm   LEFT ATRIUM             Index       RIGHT ATRIUM           Index  LA diam:        3.30 cm 1.96 cm/m  RA Area:     12.40 cm  LA Vol (A2C):   46.7 ml 27.67 ml/m RA Volume:   28.60 ml  16.94 ml/m  LA Vol (A4C):   35.1 ml 20.80 ml/m  LA Biplane Vol: 42.3 ml 25.06 ml/m   AORTIC VALVE  AV Area (Vmax):    0.48 cm  AV Area (Vmean):   0.45 cm  AV Area (VTI):     0.41 cm  AV Vmax:           553.00 cm/s  AV Vmean:          433.000 cm/s  AV VTI:            1.490 m  AV Peak Grad:      122.3 mmHg  AV Mean Grad:      83.0 mmHg  LVOT Vmax:         83.65 cm/s  LVOT Vmean:        62.250 cm/s  LVOT VTI:           0.196 m  LVOT/AV VTI ratio: 0.13     AORTA  Ao Root diam: 3.20 cm  Ao Asc diam:  4.20 cm  Ao Desc diam: 2.00 cm   MITRAL VALVE                TRICUSPID VALVE  MV Area (PHT): 9.14 cm     TR Peak grad:   6.7 mmHg  MV Decel Time: 83 msec      TR Vmax:        129.00 cm/s  MV E velocity: 98.30 cm/s  MV A velocity: 146.00 cm/s  SHUNTS  MV E/A ratio:  0.67         Systemic VTI:  0.20 m                              Systemic Diam: 2.00 cm   Shirlee More MD  Electronically signed by Shirlee More MD  Signature Date/Time: 07/30/2020/4:57:58 PM        RIGHT/LEFT HEART CATH AND CORONARY ANGIOGRAPHY   Conclusion    ? Prox RCA lesion is 30% stenosed. ? Mid RCA lesion is 50% stenosed. ? Mid Cx lesion is 30% stenosed. ? Prox LAD lesion is 30% stenosed. ? Mid  LAD lesion is 50% stenosed.   1. Non-obstructive CAD 2. Severe aortic stenosis (mean gradient 64 mmHg, peak to peak gradient 110 mmHg, AVA 0.46 cm2).   Will continue workup for TAVR. We will arrange CT scans and then proceed with official surgical consultation.      Indications  Severe aortic stenosis [I35.0 (ICD-10-CM)]   Procedural Details  Technical Details Indication: Severe aortic stenosis  Procedure: The risks, benefits, complications, treatment options, and expected outcomes were discussed with the patient. The patient and/or family concurred with the proposed plan, giving informed consent. The patient was brought to the cath lab after IV hydration was given. The patient was sedated with Versed and Fentanyl. The right groin was prepped and draped. U/S guided access into the right femoral vein with placement of a 7 French sheath in the right femoral vein. Right heart catheterization performed with a balloon tipped catheter. The right wrist was prepped and draped in a sterile fashion. 1% lidocaine was used for local anesthesia. Using the modified Seldinger access technique, a 5 French sheath was placed in the right  radial artery. 3 mg Verapamil was given through the sheath. 3500 units IV heparin was given. Standard diagnostic catheters were used to perform selective coronary angiography. I crossed the aortic valve with an AL-1 catheter and a straight wire. LV pressures measured. No LV gram. The sheath was removed from the right radial artery and a Terumo hemostasis band was applied at the arteriotomy site on the right wrist.    Estimated blood loss <50 mL.   During this procedure medications were administered to achieve and maintain moderate conscious sedation while the patient's heart rate, blood pressure, and oxygen saturation were continuously monitored and I was present face-to-face 100% of this time.   Medications (Filter: Administrations occurring from 0830 to 0930 on 08/05/20)  important  Continuous medications are totaled by the amount administered until 08/05/20 0930.   fentaNYL (SUBLIMAZE) injection (mcg) Total dose:  50 mcg  Date/Time Rate/Dose/Volume Action  08/05/20 0837 25 mcg Given  0859 25 mcg Given   midazolam (VERSED) injection (mg) Total dose:  2 mg  Date/Time Rate/Dose/Volume Action  08/05/20 0838 1 mg Given  0858 1 mg Given   lidocaine (PF) (XYLOCAINE) 1 % injection (mL) Total volume:  24 mL  Date/Time Rate/Dose/Volume Action  08/05/20 0848 2 mL Given  0851 2 mL Given  0858 20 mL Given   Radial Cocktail/Verapamil only (mL) Total volume:  10 mL  Date/Time Rate/Dose/Volume Action  08/05/20 0853 10 mL Given   heparin sodium (porcine) injection (Units) Total dose:  3,500 Units  Date/Time Rate/Dose/Volume Action  08/05/20 0907 3,500 Units Given   iohexol (OMNIPAQUE) 350 MG/ML injection (mL) Total volume:  40 mL  Date/Time Rate/Dose/Volume Action  08/05/20 0924 40 mL Given   Heparin (Porcine) in NaCl 1000-0.9 UT/500ML-% SOLN (mL) Total volume:  1,000 mL  Date/Time Rate/Dose/Volume Action  08/05/20 0920 500 mL Given  0921 500 mL Given    Sedation  Time  Sedation Time Physician-1: 40 minutes 48 seconds   Contrast  Medication Name Total Dose iohexol (OMNIPAQUE) 350 MG/ML injection 40 mL   Radiation/Fluoro  Fluoro time: 5.6 (min) DAP: 80881 (mGycm2) Cumulative Air Kerma: 103 (mGy)  Complications   Complications documented before study signed (08/05/2020  9:35 AM)    RIGHT/LEFT HEART CATH AND CORONARY ANGIOGRAPHY   None Documented by Burnell Blanks, MD 08/05/2020  9:27 AM Date Found: 08/05/2020 Time Range: Theadora Rama  Coronary Findings   Diagnostic Dominance: Right  Left Anterior Descending Vessel is large. Prox LAD lesion is 30% stenosed. Mid LAD lesion is 50% stenosed. Left Circumflex Vessel is large. Mid Cx lesion is 30% stenosed. Right Coronary Artery Vessel is large. Prox RCA lesion is 30% stenosed. Mid RCA lesion is 50% stenosed.  Intervention   No interventions have been documented.          Coronary Diagrams   Diagnostic Dominance: Right    Intervention     Implants     No implant documentation for this case.   Syngo Images   Show images for CARDIAC CATHETERIZATION  Images on Long Term Storage   Show images for [place], Dulcinea  Link to Procedure Log   Procedure Log    Hemo Data  Flowsheet Row Most Recent Value Fick Cardiac Output 4.35 L/min Fick Cardiac Output Index 2.57 (L/min)/BSA Aortic Mean Gradient 64.17 mmHg Aortic Peak Gradient 110 mmHg Aortic Valve Area 0.46 Aortic Value Area Index 0.27 cm2/BSA RA A Wave 6 mmHg RA V Wave 3 mmHg RA Mean 3 mmHg RV Systolic Pressure 31 mmHg RV Diastolic Pressure 2 mmHg RV EDP 5 mmHg PA Systolic Pressure 28 mmHg PA Diastolic Pressure 8 mmHg PA Mean 17 mmHg PW A Wave 11 mmHg PW V Wave 10 mmHg PW Mean 8 mmHg AO Systolic Pressure 93 mmHg AO Diastolic Pressure 42 mmHg AO Mean 64 mmHg LV Systolic Pressure 198 mmHg LV Diastolic Pressure 8 mmHg LV EDP 22 mmHg AOp Systolic Pressure 88 mmHg AOp  Diastolic Pressure 40 mmHg AOp Mean Pressure 61 mmHg LVp Systolic Pressure 198 mmHg LVp Diastolic Pressure 12 mmHg LVp EDP Pressure 24 mmHg QP/QS 1 TPVR Index 6.61 HRUI TSVR Index 24.88 HRUI PVR SVR Ratio 0.15 TPVR/TSVR Ratio 0.27    Cardiac TAVR CT   TECHNIQUE: The patient was scanned on a Siemens Force 192 slice scanner. A 120 kV retrospective scan was triggered in the descending thoracic aorta at 111 HU's. Gantry rotation speed was 270 msecs and collimation was .9 mm. No beta blockade or nitro were given. The 3D data set was reconstructed in 5% intervals of the R-R cycle. Systolic and diastolic phases were analyzed on a dedicated work station using MPR, MIP and VRT modes. The patient received 95 cc of contrast.   FINDINGS: Aortic Valve: Severely thickened aortic valve with heavy calcification and reduced excursion the planimeter valve area is 0.897 Sq cm consistent with severe aortic stenosis   Number of leaflets: There is calcific fusion: valve appears functionally bicuspid   LVOT calcification: None   Annular calcification: Minimal   Aortic Valve Calcium Score: 4031   Prosthetic Valve: NA   Mitral Valve: No MAC   LV: Left ventricular function is normal with estimated LVEF 60-65%.   Aortic Annulus Measurements   Major annulus diameter: 27 mm   Minor annulus diameter: 21 mm   Annular perimeter: 4.34  cm2   Annular area: 76 mm   Aortic Root Measurements-   Sinus of Valsalva perimeter: 102 mm   Sinotubular Junction: 30 mm   Ascending Thoracic Aorta: 40 mm   Aortic Arch: 24 mm   Descending Thoracic Aorta: 14 mm X 14 mm at narrowest that reconstitutes to 19 mm below narrowing (< 50% change)   Descending aortic atherosclerosis noted.   No evidence of aortic collateral flow or post surgical changes   Sinus of Valsalva Measurements:   Right coronary cusp width: 29 mm   Left coronary cusp   width: 30 mm   Non coronary cusp width: 32 mm    Coronary Artery Height above Annulus:   Left Main: 11.6 mm   Right Coronary: 9.6  mm   Coronary Calcium Score:   Left main: 10   Left anterior descending artery: 132   Left circumflex artery: 36   Right coronary artery: 106   Total: 284   Percentile: 69th for age, sex, and race matched control.   Optimum Fluoroscopic Angle for Delivery: LAO 1, CAU 4 (Anterior View)   Valves for structural team consideration: 29 mm CoreValve; 26 mm Edwards Sapien   IMPRESSION: 1. Severe Aortic stenosis. Findings pertinent to TAVR procedure are detailed above.   2. Descending aortic narrow as above; no post surgical changes noted.   3. Coronary calcium score of 287. This was 69th percentile for age, sex, and race matched control.   4.  Mild aortic aneurysm: 40 mm.   RECOMMENDATIONS:   Coronary artery calcium (CAC) score is a strong predictor of incident coronary heart disease (CHD) and provides predictive information beyond traditional risk factors. CAC scoring is reasonable to use in the decision to withhold, postpone, or initiate statin therapy in intermediate-risk or selected borderline-risk asymptomatic adults (age 69-75 years and LDL-C >=70 to <190 mg/dL) who do not have diabetes or established atherosclerotic cardiovascular disease (ASCVD).* In intermediate-risk (10-year ASCVD risk >=7.5% to <20%) adults or selected borderline-risk (10-year ASCVD risk >=5% to <7.5%) adults in whom a CAC score is measured for the purpose of making a treatment decision the following recommendations have been made:   If CAC = 0, it is reasonable to withhold statin therapy and reassess in 5 to 10 years, as long as higher risk conditions are absent (diabetes mellitus, family history of premature CHD in first degree relatives (males <55 years; females <65 years), cigarette smoking, LDL >=190 mg/dL or other independent risk factors).   If CAC is 1 to 99, it is reasonable to initiate statin  therapy for patients >=13 years of age.   If CAC is >=100 or >=75th percentile, it is reasonable to initiate statin therapy at any age.   Cardiology referral should be considered for patients with CAC scores >=400 or >=75th percentile.   *2018 AHA/ACC/AACVPR/AAPA/ABC/ACPM/ADA/AGS/APhA/ASPC/NLA/PCNA Guideline on the Management of Blood Cholesterol: A Report of the American College of Cardiology/American Heart Association Task Force on Clinical Practice Guidelines. J Am Coll Cardiol. 2019;73(24):3168-3209.   Mahesh  Chandrasekhar     Electronically Signed   By: Rudean Haskell MD   On: 08/14/2020 15:20     CT ANGIOGRAPHY CHEST, ABDOMEN AND PELVIS   TECHNIQUE: Multidetector CT imaging through the chest, abdomen and pelvis was performed using the standard protocol during bolus administration of intravenous contrast. Multiplanar reconstructed images and MIPs were obtained and reviewed to evaluate the vascular anatomy.   CONTRAST:  52m OMNIPAQUE IOHEXOL 350 MG/ML SOLN   COMPARISON:  No priors.   FINDINGS: CTA CHEST FINDINGS   Cardiovascular: Heart size is normal. There is no significant pericardial fluid, thickening or pericardial calcification. There is aortic atherosclerosis, as well as atherosclerosis of the great vessels of the mediastinum and the coronary arteries, including calcified atherosclerotic plaque in the left anterior descending coronary artery. Severe thickening calcification of the aortic valve. Ectasia of ascending thoracic aorta (4.0 cm in diameter). In the anterior mediastinum (axial image 60 of series 6) there is a well-defined 2.7 x 1.5 cm low-attenuation lesion anterior to the ascending thoracic aorta, favored to represent  a small pericardial cyst.   Mediastinum/Lymph Nodes: No pathologically enlarged mediastinal or hilar lymph nodes. Esophagus is unremarkable in appearance. No axillary lymphadenopathy.   Lungs/Pleura: No suspicious  appearing pulmonary nodules or masses are noted. No acute consolidative airspace disease. No pleural effusions.   Musculoskeletal/Soft Tissues: Hypoplastic left fourth rib. There are no aggressive appearing lytic or blastic lesions noted in the visualized portions of the skeleton.   CTA ABDOMEN AND PELVIS FINDINGS   Hepatobiliary: Diffuse low attenuation throughout the hepatic parenchyma, indicative of hepatic steatosis. The contour of the liver is slightly nodular, suggesting early changes of cirrhosis. No suspicious cystic or solid hepatic lesions. No intra or extrahepatic biliary ductal dilatation. Gallbladder is normal in appearance.   Pancreas: No pancreatic mass. No pancreatic ductal dilatation. No pancreatic or peripancreatic fluid collections or inflammatory changes.   Spleen: Unremarkable.   Adrenals/Urinary Tract: Nonobstructive calculi in the collecting systems of both kidneys measuring up to 1.5 x 0.7 cm in the right renal pelvis. Numerous subcentimeter low-attenuation lesions in both kidneys, too small to characterize, but statistically likely to represent tiny cysts. Mild multifocal cortical thinning and parenchymal atrophy in the kidneys bilaterally (right greater than left), presumably chronic post infectious or inflammatory scarring. Bilateral adrenal glands are normal in appearance. No hydroureteronephrosis. Urinary bladder is normal in appearance.   Stomach/Bowel: The appearance of the stomach is normal. No pathologic dilatation of small bowel or colon. Numerous colonic diverticulae are noted, without surrounding inflammatory changes to suggest an acute diverticulitis at this time. Normal appendix.   Vascular/Lymphatic: Aortic atherosclerosis, without evidence of aneurysm or dissection in the abdominal or pelvic vasculature. Vascular findings and measurements pertinent to potential TAVR procedure, as detailed below. No lymphadenopathy noted in the abdomen  or pelvis.   Reproductive: Bilateral tubal ligation clips are incidentally noted. Uterus and ovaries are otherwise unremarkable in appearance.   Other: No significant volume of ascites.  No pneumoperitoneum.   Musculoskeletal: There are no aggressive appearing lytic or blastic lesions noted in the visualized portions of the skeleton.   VASCULAR MEASUREMENTS PERTINENT TO TAVR:   AORTA:   Minimal Aortic Diameter-12 x 10 mm   Severity of Aortic Calcification-mild   RIGHT PELVIS:   Right Common Iliac Artery -   Minimal Diameter-8.6 x 9.5 mm   Tortuosity-mild   Calcification-mild   Right External Iliac Artery -   Minimal Diameter-7.2 x 6.9 mm   Tortuosity-moderate to severe   Calcification-none   Right Common Femoral Artery -   Minimal Diameter-7.3 x 6.6 mm   Tortuosity-mild   Calcification-none   LEFT PELVIS:   Left Common Iliac Artery -   Minimal Diameter-8.3 x 8.3 mm   Tortuosity-mild   Calcification-mild   Left External Iliac Artery -   Minimal Diameter-7.9 x 7.3 mm   Tortuosity-moderate to severe   Calcification-none   Left Common Femoral Artery -   Minimal Diameter-6.5 x 6.7 mm   Tortuosity-mild   Calcification-minimal   Review of the MIP images confirms the above findings.   IMPRESSION: 1. Vascular findings and measurements pertinent to potential TAVR procedure, as detailed above. 2. Severe thickening calcification of the aortic valve. 3. Aortic atherosclerosis, in addition to left anterior descending coronary artery disease. There is also ectasia of ascending thoracic aorta (4.0 cm in diameter). Recommend annual imaging followup by CTA or MRA. This recommendation follows 2010 ACCF/AHA/AATS/ACR/ASA/SCA/SCAI/SIR/STS/SVM Guidelines for the Diagnosis and Management of Patients with Thoracic Aortic Disease. Circulation. 2010; 121: N235-T732. Aortic aneurysm NOS (ICD10-I71.9). 4. Hepatic  steatosis with evidence of developing  cirrhosis. 5. Nonobstructive calculi in the collecting systems of both kidneys measuring up to 1.5 x 0.7 cm in the right renal pelvis. 6. Colonic diverticulosis without evidence of acute diverticulitis at this time. 7. Additional incidental findings, as above.     Electronically Signed   By: Vinnie Langton M.D.   On: 08/12/2020 15:09   _______________________________  Procedure: Isolated AVR  Risk of Mortality:  2.826%  Renal Failure:  1.717%  Permanent Stroke:  3.109%  Prolonged Ventilation:  7.083%  DSW Infection:  0.080%  Reoperation:  3.271%  Morbidity or Mortality:  11.134%  Short Length of Stay:  33.340%  Long Length of Stay:  5.262%     Impression:  Patient has stage D1 severe symptomatic aortic stenosis.  She describes classical symptoms of progressive exertional shortness of breath and chest discomfort over the past 2 to 3 years with recent symptoms consistent with chronic diastolic congestive heart failure and angina pectoris, [place] Heart Association functional class III.  In addition, the patient has had several episodes of severe dizziness and near syncope all in association with physical exertion.  I have personally reviewed the patient's recent transthoracic echocardiogram, diagnostic cardiac catheterization, EKG, and CT angiograms.  Echocardiogram demonstrates the presence of Sievers type I bicuspid aortic valve with critical aortic stenosis.  There is a single raphae with moderate calcification but severely restricted leaflet mobility.  Peak velocity across aortic valve measured greater than 5.5 m/s corresponding to mean transvalvular gradient estimated 83 mmHg and aortic valve area calculated only 0.41 cm by VTI.  Left ventricular systolic function remains hyperdynamic but there is moderate to severe left ventricular hypertrophy.  Diagnostic cardiac catheterization confirmed the presence of severe aortic stenosis with peak to peak and mean transvalvular  gradients measured 110 and 64 mmHg, respectively, corresponding to aortic valve area calculated only 0.46 cm.  There was nonobstructive coronary artery disease.  EKG reveals sinus rhythm with no significant AV conduction delay.  Cardiac-gated CTA of the heart reveals anatomical characteristics consistent with aortic stenosis suitable for treatment by transcatheter aortic valve replacement without any significant complicating feature and CTA of the aorta and iliac vessels demonstrate what appears to be adequate pelvic vascular access to facilitate a transfemoral approach other than mild narrowing at the site of previous repair of coarctation.  I agree the patient needs aortic valve replacement as soon as practical.  Risks associated with conventional surgery would be slightly elevated because of the patient's advanced age.  She appears to be reasonably good candidate for transcatheter aortic valve replacement without any significant complicating features.    Plan:  The patient and her husband were counseled at length regarding treatment alternatives for management of severe symptomatic aortic stenosis. Alternative approaches such as conventional aortic valve replacement, transcatheter aortic valve replacement, and continued medical therapy without intervention were compared and contrasted at length.  The risks associated with conventional surgical aortic valve replacement were discussed in detail, as were expectations for post-operative convalescence, and why I would favor transcatheter aortic valve replacement over conventional surgery.  Issues specific to transcatheter aortic valve replacement were discussed including questions about long term valve durability, the potential for paravalvular leak, possible increased risk of need for permanent pacemaker placement, and other technical complications related to the procedure itself.  Long-term prognosis with medical therapy was discussed. This discussion was  placed in the context of the patient's own specific clinical presentation and past medical history.  All of their  questions have been addressed.  The patient desires to proceed with transcatheter aortic valve replacement as soon as practical.  We tentatively plan to proceed with surgery on August 18, 2020.  We discussed the technical aspects of transcatheter aortic valve replacement including what types of management strategies would be attempted intraoperatively in the event of life-threatening complications, including whether or not the patient would be considered a candidate for the use of cardiopulmonary bypass and/or conversion to open sternotomy for attempted surgical intervention.  The patient has been advised of a variety of complications that might develop including but not limited to risks of death, stroke, paravalvular leak, aortic dissection or other major vascular complications, aortic annulus rupture, device embolization, cardiac rupture or perforation, mitral regurgitation, acute myocardial infarction, arrhythmia, heart block or bradycardia requiring permanent pacemaker placement, congestive heart failure, respiratory failure, renal failure, pneumonia, infection, other late complications related to structural valve deterioration or migration, valve thrombosis or infection (endocarditis), or other complications that might ultimately cause a temporary or permanent loss of functional independence or other long term morbidity.  The patient provides full informed consent for the procedure as planned and all questions were answered.       I spent in excess of 90 minutes during the conduct of this office consultation and >50% of this time involved direct face-to-face encounter with the patient for counseling and/or coordination of their care.    Valentina Gu. Roxy Manns, MD 08/14/2020 4:29 PM

## 2020-08-14 NOTE — Progress Notes (Addendum)
[address]       [place] [address]             [id number]     CARDIOTHORACIC SURGERY CONSULTATION REPORT  Referring Provider is Bettina Gavia, Hilton Cork, MD PCP is Ronita Hipps, MD  Chief Complaint  Patient presents with   Aortic Stenosis    Surgical consult for TAVR, review all testing    HPI:  Patient is an 80 year old female with history of coarctation of the aorta repaired at age 68, hypertension, hyperlipidemia, and sleep apnea who has been referred for surgical consultation to discuss treatment options for management of recently discovered symptomatic critical aortic stenosis.  Patient states that she underwent uncomplicated repair of coarctation of the aorta at age 67 by Dr. Bluford Main at [facility].  She has been remarkably healthy and relatively physically active all of her adult life until recently.  Over the past 2 to 3 years she has developed progressive symptoms of exertional shortness of breath and fatigue.  Since this past Thanksgiving her symptoms have gotten much more severe such that she gets short of breath with very low level activity.  In addition she has had several episodes of near syncope that are always brought on with physical exertion.  She has had some chest tightness and chest pressure with exertion.  She has not had any resting shortness of breath or chest discomfort.  She has not had any history of orthopnea or lower extremity edema.  Patient was recently referred for cardiology consultation by her primary care physician and was initially evaluated by Dr. Bettina Gavia on Jul 24, 2020.  She was noted to have a prominent systolic murmur on physical exam and transthoracic echocardiogram revealed critical aortic stenosis with normal left ventricular systolic function.  She was promptly referred to the multidisciplinary heart valve clinic and underwent diagnostic cardiac catheterization by Dr. Angelena Form.  Catheterization revealed  nonobstructive coronary artery disease and confirmed the presence of severe aortic stenosis.  CT angiography was performed and the patient was referred for surgical consultation.  Patient is married and lives locally in the country between [place] and [place].  Up until last 2 to 3 years patient has remained quite active physically.  She enjoys hiking and kayaking with her husband.  She is now severely limited by exertional shortness of breath and chest discomfort as noted previously.  She also has history of intermittent gout but this does not affect her ambulation to any significant degree.  Appetite is good.  She has not had fevers or chills.  She denies any productive cough.  Past Medical History:  Diagnosis Date   Benign essential hypertension    Benign paroxysmal positional vertigo    History of nephrolithiasis    Nuclear sclerotic cataract of both eyes 12/30/2013   Primary open angle glaucoma of both eyes, severe stage 12/30/2013   Pure hypercholesterolemia    Sleep apnea    CPAP    Past Surgical History:  Procedure Laterality Date   BREAST BIOPSY     Benign   COARCTATION OF AORTA REPAIR     Age 31   RIGHT/LEFT HEART CATH AND CORONARY ANGIOGRAPHY N/A 08/05/2020   Procedure: RIGHT/LEFT HEART CATH AND CORONARY ANGIOGRAPHY;  Surgeon: Burnell Blanks, MD;  Location: [facility];  Service: Cardiovascular;  Laterality: N/A;    Family History  Problem Relation Age of Onset   Heart attack Mother    Congestive Heart  Failure Father    Hypertension Sister     Social History   Socioeconomic History   Marital status: Unknown    Spouse name: Not on file   Number of children: Not on file   Years of education: Not on file   Highest education level: Not on file  Occupational History   Not on file  Tobacco Use   Smoking status: Never   Smokeless tobacco: Never  Substance and Sexual Activity   Alcohol use: Yes    Alcohol/week: 1.0 standard drink    Types: 1 Glasses of  wine per week    Comment: Rare   Drug use: Never   Sexual activity: Not on file  Other Topics Concern   Not on file  Social History Narrative   Not on file   Social Determinants of Health   Financial Resource Strain: Not on file  Food Insecurity: Not on file  Transportation Needs: Not on file  Physical Activity: Not on file  Stress: Not on file  Social Connections: Not on file  Intimate Partner Violence: Not on file    Current Outpatient Medications  Medication Sig Dispense Refill   bisoprolol-hydrochlorothiazide (ZIAC) 10-6.25 MG tablet Take 0.5 tablets by mouth daily.     brimonidine (ALPHAGAN) 0.2 % ophthalmic solution Place 1 drop into both eyes 3 (three) times daily.     dorzolamide (TRUSOPT) 2 % ophthalmic solution Place 1 drop into both eyes 3 (three) times daily.     fluticasone (FLONASE) 50 MCG/ACT nasal spray Place 2 sprays into both nostrils as needed for congestion.     Latanoprostene Bunod (VYZULTA) 0.024 % SOLN Apply 1 drop to eye at bedtime.     Netarsudil Dimesylate (RHOPRESSA) 0.02 % SOLN Place 1 drop into both eyes at bedtime.     ramipril (ALTACE) 5 MG capsule Take 5 mg by mouth daily.     triamterene-hydrochlorothiazide (MAXZIDE-25) 37.5-25 MG tablet Take 0.5 tablets by mouth daily.     No current facility-administered medications for this visit.    Allergies  Allergen Reactions   Sulfa Antibiotics Rash      Review of Systems:   General:  normal appetite, decreased energy, no weight gain, no weight loss, no fever  Cardiac:  + chest pain with exertion, no chest pain at rest, +SOB with low level exertion, no resting SOB, no PND, no orthopnea, no palpitations, no arrhythmia, no atrial fibrillation, no LE edema, + dizzy spells, near syncope  Respiratory:  + exertional shortness of breath, no home oxygen, no productive cough, no dry cough, no bronchitis, no wheezing, no hemoptysis, no asthma, no pain with inspiration or cough, + sleep apnea, + CPAP at  night  GI:   no difficulty swallowing, no reflux, no frequent heartburn, no hiatal hernia, no abdominal pain, no constipation, no diarrhea, no hematochezia, no hematemesis, no melena  GU:   no dysuria,  no frequency, no urinary tract infection, no hematuria, no kidney stones, no kidney disease  Vascular:  no pain suggestive of claudication, no pain in feet, no leg cramps, no varicose veins, no DVT, no non-healing foot ulcer  Neuro:   no stroke, no TIA's, no seizures, no headaches, no temporary blindness one eye,  no slurred speech, no peripheral neuropathy, no chronic pain, no instability of gait, no memory/cognitive dysfunction  Musculoskeletal: + arthritis, no joint swelling, no myalgias, no difficulty walking, normal mobility   Skin:   no rash, no itching, no skin infections, no pressure sores or  ulcerations  Psych:   no anxiety, no depression, no nervousness, no unusual recent stress  Eyes:   no blurry vision, no floaters, no recent vision changes, + wears glasses or contacts  ENT:   no hearing loss, no loose or painful teeth, no dentures, last saw dentist within the past 6 months  Hematologic:  no easy bruising, no abnormal bleeding, no clotting disorder, no frequent epistaxis  Endocrine:  no diabetes, does not check CBG's at home     Physical Exam:   BP (!) 160/80   Pulse 90   Resp 20   Ht _0  (1.575 m)   Wt 149 lb (67.6 kg)   SpO2 95% Comment: RA  BMI 27.25 kg/m   General:    well-appearing  HEENT:  Unremarkable   Neck:   no JVD, no bruits, no adenopathy   Chest:   clear to auscultation, symmetrical breath sounds, no wheezes, no rhonchi   CV:   RRR, grade IV/VI late peaking crescendo/decrescendo systolic murmur   Abdomen:  soft, non-tender, no masses   Extremities:  warm, well-perfused, pulses diminished, no LE edema  Rectal/GU  Deferred  Neuro:   Grossly non-focal and symmetrical throughout  Skin:   Clean and dry, no rashes, no breakdown   Diagnostic Tests:  EKG: NSR  w/out significant AV conduction delay (07/24/2020)        ECHOCARDIOGRAM REPORT        Patient Name:   Stephanie Rosario Date of Exam: 07/30/2020  Medical Rec #:  [id number]     Height:       62.0 in  Accession #:    [id number]    Weight:       149.2 lb  Date of Birth:  Apr 10, 1940     BSA:          1.688 m  Patient Age:    75 years      BP:           138/75 mmHg  Patient Gender: F             HR:           93 bpm.  Exam Location:  [facility]   Procedure: 2D Echo and Strain Analysis   Indications:    Precordial pain [R07.2 (ICD-10-CM)]; Benign essential                  hypertension [I10 (ICD-10-CM)]     History:        Patient has no prior history of Echocardiogram  examinations.                  Signs/Symptoms:Dyspnea; Risk Factors:Dyslipidemia.     Sonographer:    Luane School  Referring Phys: [id number] McColl to: Lauree Chandler MD   IMPRESSIONS     1. Left ventricular ejection fraction, by estimation, is 65 to 70%. The  left ventricle has hyperdynamic function. The left ventricle has no  regional wall motion abnormalities. There is severe concentric left  ventricular hypertrophy. Left ventricular  diastolic parameters are consistent with Grade I diastolic dysfunction  (impaired relaxation). The average left ventricular global longitudinal  strain is -6.5 %. The global longitudinal strain is abnormal.   2. Right ventricular systolic function is normal. The right ventricular  size is normal. There is normal pulmonary artery systolic pressure.   3. The mitral valve is normal in structure. No evidence of mitral valve  regurgitation. No evidence of  mitral stenosis.   4. The aortic valve has an indeterminant number of cusps. There is severe  calcifcation of the aortic valve. Aortic valve regurgitation is not  visualized. Severe/critical aortic valve stenosis.   5. There is moderate dilatation of the ascending aorta, measuring 42 mm.   6. The inferior vena cava  is normal in size with greater than 50%  respiratory variability, suggesting right atrial pressure of 3 mmHg.   FINDINGS   Left Ventricle: Left ventricular ejection fraction, by estimation, is 65  to 70%. The left ventricle has hyperdynamic function. The left ventricle  has no regional wall motion abnormalities. The average left ventricular  global longitudinal strain is -6.5  %. The global longitudinal strain is abnormal. The left ventricular  internal cavity size was normal in size. There is severe concentric left  ventricular hypertrophy. Left ventricular diastolic parameters are  consistent with Grade I diastolic dysfunction  (impaired relaxation). Indeterminate filling pressures.   Right Ventricle: The right ventricular size is normal. No increase in  right ventricular wall thickness. Right ventricular systolic function is  normal. There is normal pulmonary artery systolic pressure. The tricuspid  regurgitant velocity is 1.29 m/s, and   with an assumed right atrial pressure of 3 mmHg, the estimated right  ventricular systolic pressure is 9.7 mmHg.   Left Atrium: Left atrial size was normal in size.   Right Atrium: Right atrial size was normal in size.   Pericardium: There is no evidence of pericardial effusion.   Mitral Valve: The mitral valve is normal in structure. No evidence of  mitral valve regurgitation. No evidence of mitral valve stenosis.   Tricuspid Valve: The tricuspid valve is normal in structure. Tricuspid  valve regurgitation is trivial. No evidence of tricuspid stenosis.   Aortic Valve: The aortic valve has an indeterminant number of cusps. There  is severe calcifcation of the aortic valve. Aortic valve regurgitation is  not visualized. Severe/critical aortic stenosis is present. Aortic valve  mean gradient measures 83.0  mmHg. Aortic valve peak gradient measures 122.3 mmHg. Aortic valve area,  by VTI measures 0.41 cm.   Pulmonic Valve: The pulmonic valve  was normal in structure. Pulmonic valve  regurgitation is not visualized. No evidence of pulmonic stenosis.   Aorta: The aortic root is normal in size and structure and the aortic arch  was not well visualized. There is moderate dilatation of the ascending  aorta, measuring 42 mm.   Venous: A normal flow pattern is recorded from the right upper pulmonary  vein. The inferior vena cava is normal in size with greater than 50%  respiratory variability, suggesting right atrial pressure of 3 mmHg.   IAS/Shunts: No atrial level shunt detected by color flow Doppler.      LEFT VENTRICLE  PLAX 2D  LVIDd:         3.30 cm     Diastology  LVIDs:         2.10 cm     LV e' medial:    3.26 cm/s  LV PW:         1.60 cm     LV E/e' medial:  30.2  LV IVS:        1.60 cm     LV e' lateral:   4.03 cm/s  LVOT diam:     2.00 cm     LV E/e' lateral: 24.4  LV SV:         62  LV SV Index:  21          2D Longitudinal Strain  LVOT Area:     3.14 cm    2D Strain GLS Avg:     -6.5 %     LV Volumes (MOD)  LV vol d, MOD A2C: 49.0 ml  LV vol d, MOD A4C: 54.7 ml  LV vol s, MOD A2C: 23.6 ml  LV vol s, MOD A4C: 24.5 ml  LV SV MOD A2C:     25.4 ml  LV SV MOD A4C:     54.7 ml  LV SV MOD BP:      28.2 ml   RIGHT VENTRICLE             IVC  RV S prime:     10.70 cm/s  IVC diam: 1.30 cm  TAPSE (M-mode): 2.0 cm   LEFT ATRIUM             Index       RIGHT ATRIUM           Index  LA diam:        3.30 cm 1.96 cm/m  RA Area:     12.40 cm  LA Vol (A2C):   46.7 ml 27.67 ml/m RA Volume:   28.60 ml  16.94 ml/m  LA Vol (A4C):   35.1 ml 20.80 ml/m  LA Biplane Vol: 42.3 ml 25.06 ml/m   AORTIC VALVE  AV Area (Vmax):    0.48 cm  AV Area (Vmean):   0.45 cm  AV Area (VTI):     0.41 cm  AV Vmax:           553.00 cm/s  AV Vmean:          433.000 cm/s  AV VTI:            1.490 m  AV Peak Grad:      122.3 mmHg  AV Mean Grad:      83.0 mmHg  LVOT Vmax:         83.65 cm/s  LVOT Vmean:        62.250 cm/s  LVOT VTI:           0.196 m  LVOT/AV VTI ratio: 0.13     AORTA  Ao Root diam: 3.20 cm  Ao Asc diam:  4.20 cm  Ao Desc diam: 2.00 cm   MITRAL VALVE                TRICUSPID VALVE  MV Area (PHT): 9.14 cm     TR Peak grad:   6.7 mmHg  MV Decel Time: 83 msec      TR Vmax:        129.00 cm/s  MV E velocity: 98.30 cm/s  MV A velocity: 146.00 cm/s  SHUNTS  MV E/A ratio:  0.67         Systemic VTI:  0.20 m                              Systemic Diam: 2.00 cm   Shirlee More MD  Electronically signed by Shirlee More MD  Signature Date/Time: 07/30/2020/4:57:58 PM        RIGHT/LEFT HEART CATH AND CORONARY ANGIOGRAPHY   Conclusion    ? Prox RCA lesion is 30% stenosed. ? Mid RCA lesion is 50% stenosed. ? Mid Cx lesion is 30% stenosed. ? Prox LAD lesion is 30% stenosed. ? Mid  LAD lesion is 50% stenosed.   1. Non-obstructive CAD 2. Severe aortic stenosis (mean gradient 64 mmHg, peak to peak gradient 110 mmHg, AVA 0.46 cm2).   Will continue workup for TAVR. We will arrange CT scans and then proceed with official surgical consultation.      Indications  Severe aortic stenosis [I35.0 (ICD-10-CM)]   Procedural Details  Technical Details Indication: Severe aortic stenosis  Procedure: The risks, benefits, complications, treatment options, and expected outcomes were discussed with the patient. The patient and/or family concurred with the proposed plan, giving informed consent. The patient was brought to the cath lab after IV hydration was given. The patient was sedated with Versed and Fentanyl. The right groin was prepped and draped. U/S guided access into the right femoral vein with placement of a 7 French sheath in the right femoral vein. Right heart catheterization performed with a balloon tipped catheter. The right wrist was prepped and draped in a sterile fashion. 1% lidocaine was used for local anesthesia. Using the modified Seldinger access technique, a 5 French sheath was placed in the right  radial artery. 3 mg Verapamil was given through the sheath. 3500 units IV heparin was given. Standard diagnostic catheters were used to perform selective coronary angiography. I crossed the aortic valve with an AL-1 catheter and a straight wire. LV pressures measured. No LV gram. The sheath was removed from the right radial artery and a Terumo hemostasis band was applied at the arteriotomy site on the right wrist.    Estimated blood loss <50 mL.   During this procedure medications were administered to achieve and maintain moderate conscious sedation while the patient's heart rate, blood pressure, and oxygen saturation were continuously monitored and I was present face-to-face 100% of this time.   Medications (Filter: Administrations occurring from 0830 to 0930 on 08/05/20)  important  Continuous medications are totaled by the amount administered until 08/05/20 0930.   fentaNYL (SUBLIMAZE) injection (mcg) Total dose:  50 mcg  Date/Time Rate/Dose/Volume Action  08/05/20 0837 25 mcg Given  0859 25 mcg Given   midazolam (VERSED) injection (mg) Total dose:  2 mg  Date/Time Rate/Dose/Volume Action  08/05/20 0838 1 mg Given  0858 1 mg Given   lidocaine (PF) (XYLOCAINE) 1 % injection (mL) Total volume:  24 mL  Date/Time Rate/Dose/Volume Action  08/05/20 0848 2 mL Given  0851 2 mL Given  0858 20 mL Given   Radial Cocktail/Verapamil only (mL) Total volume:  10 mL  Date/Time Rate/Dose/Volume Action  08/05/20 0853 10 mL Given   heparin sodium (porcine) injection (Units) Total dose:  3,500 Units  Date/Time Rate/Dose/Volume Action  08/05/20 0907 3,500 Units Given   iohexol (OMNIPAQUE) 350 MG/ML injection (mL) Total volume:  40 mL  Date/Time Rate/Dose/Volume Action  08/05/20 0924 40 mL Given   Heparin (Porcine) in NaCl 1000-0.9 UT/500ML-% SOLN (mL) Total volume:  1,000 mL  Date/Time Rate/Dose/Volume Action  08/05/20 0920 500 mL Given  0921 500 mL Given    Sedation  Time  Sedation Time Physician-1: 40 minutes 48 seconds   Contrast  Medication Name Total Dose iohexol (OMNIPAQUE) 350 MG/ML injection 40 mL   Radiation/Fluoro  Fluoro time: 5.6 (min) DAP: 11031 (mGycm2) Cumulative Air Kerma: 594 (mGy)  Complications   Complications documented before study signed (08/05/2020  9:35 AM)    RIGHT/LEFT HEART CATH AND CORONARY ANGIOGRAPHY   None Documented by Burnell Blanks, MD 08/05/2020  9:27 AM Date Found: 08/05/2020 Time Range: Theadora Rama  Coronary Findings   Diagnostic Dominance: Right  Left Anterior Descending Vessel is large. Prox LAD lesion is 30% stenosed. Mid LAD lesion is 50% stenosed. Left Circumflex Vessel is large. Mid Cx lesion is 30% stenosed. Right Coronary Artery Vessel is large. Prox RCA lesion is 30% stenosed. Mid RCA lesion is 50% stenosed.  Intervention   No interventions have been documented.          Coronary Diagrams   Diagnostic Dominance: Right    Intervention     Implants     No implant documentation for this case.   Syngo Images   Show images for CARDIAC CATHETERIZATION  Images on Long Term Storage   Show images for Stephanie Rosario, Stephanie Rosario to Procedure Log   Procedure Log    Hemo Data  Flowsheet Row Most Recent Value Fick Cardiac Output 4.35 L/min Fick Cardiac Output Index 2.57 (L/min)/BSA Aortic Mean Gradient 64.17 mmHg Aortic Peak Gradient 110 mmHg Aortic Valve Area 0.46 Aortic Value Area Index 0.27 cm2/BSA RA A Wave 6 mmHg RA V Wave 3 mmHg RA Mean 3 mmHg RV Systolic Pressure 31 mmHg RV Diastolic Pressure 2 mmHg RV EDP 5 mmHg PA Systolic Pressure 28 mmHg PA Diastolic Pressure 8 mmHg PA Mean 17 mmHg PW A Wave 11 mmHg PW V Wave 10 mmHg PW Mean 8 mmHg AO Systolic Pressure 93 mmHg AO Diastolic Pressure 42 mmHg AO Mean 64 mmHg LV Systolic Pressure 759 mmHg LV Diastolic Pressure 8 mmHg LV EDP 22 mmHg AOp Systolic Pressure 88 mmHg AOp  Diastolic Pressure 40 mmHg AOp Mean Pressure 61 mmHg LVp Systolic Pressure 163 mmHg LVp Diastolic Pressure 12 mmHg LVp EDP Pressure 24 mmHg QP/QS 1 TPVR Index 6.61 HRUI TSVR Index 24.88 HRUI PVR SVR Ratio 0.15 TPVR/TSVR Ratio 0.27    Cardiac TAVR CT   TECHNIQUE: The patient was scanned on a Siemens Force 846 slice scanner. A 120 kV retrospective scan was triggered in the descending thoracic aorta at 111 HU's. Gantry rotation speed was 270 msecs and collimation was .9 mm. No beta blockade or nitro were given. The 3D data set was reconstructed in 5% intervals of the R-R cycle. Systolic and diastolic phases were analyzed on a dedicated work station using MPR, MIP and VRT modes. The patient received 95 cc of contrast.   FINDINGS: Aortic Valve: Severely thickened aortic valve with heavy calcification and reduced excursion the planimeter valve area is 0.897 Sq cm consistent with severe aortic stenosis   Number of leaflets: There is calcific fusion: valve appears functionally bicuspid   LVOT calcification: None   Annular calcification: Minimal   Aortic Valve Calcium Score: 4031   Prosthetic Valve: NA   Mitral Valve: No MAC   LV: Left ventricular function is normal with estimated LVEF 60-65%.   Aortic Annulus Measurements   Major annulus diameter: 27 mm   Minor annulus diameter: 21 mm   Annular perimeter: 4.34  cm2   Annular area: 76 mm   Aortic Root Measurements-   Sinus of Valsalva perimeter: 102 mm   Sinotubular Junction: 30 mm   Ascending Thoracic Aorta: 40 mm   Aortic Arch: 24 mm   Descending Thoracic Aorta: 14 mm X 14 mm at narrowest that reconstitutes to 19 mm below narrowing (< 50% change)   Descending aortic atherosclerosis noted.   No evidence of aortic collateral flow or post surgical changes   Sinus of Valsalva Measurements:   Right coronary cusp width: 29 mm   Left coronary cusp  width: 30 mm   Non coronary cusp width: 32 mm    Coronary Artery Height above Annulus:   Left Main: 11.6 mm   Right Coronary: 9.6  mm   Coronary Calcium Score:   Left main: 10   Left anterior descending artery: 132   Left circumflex artery: 36   Right coronary artery: 106   Total: 284   Percentile: 69th for age, sex, and race matched control.   Optimum Fluoroscopic Angle for Delivery: LAO 1, CAU 4 (Anterior View)   Valves for structural team consideration: 29 mm CoreValve; 26 mm Edwards Sapien   IMPRESSION: 1. Severe Aortic stenosis. Findings pertinent to TAVR procedure are detailed above.   2. Descending aortic narrow as above; no post surgical changes noted.   3. Coronary calcium score of 287. This was 69th percentile for age, sex, and race matched control.   4.  Mild aortic aneurysm: 40 mm.   RECOMMENDATIONS:   Coronary artery calcium (CAC) score is a strong predictor of incident coronary heart disease (CHD) and provides predictive information beyond traditional risk factors. CAC scoring is reasonable to use in the decision to withhold, postpone, or initiate statin therapy in intermediate-risk or selected borderline-risk asymptomatic adults (age 69-75 years and LDL-C >=70 to <190 mg/dL) who do not have diabetes or established atherosclerotic cardiovascular disease (ASCVD).* In intermediate-risk (10-year ASCVD risk >=7.5% to <20%) adults or selected borderline-risk (10-year ASCVD risk >=5% to <7.5%) adults in whom a CAC score is measured for the purpose of making a treatment decision the following recommendations have been made:   If CAC = 0, it is reasonable to withhold statin therapy and reassess in 5 to 10 years, as long as higher risk conditions are absent (diabetes mellitus, family history of premature CHD in first degree relatives (males <55 years; females <65 years), cigarette smoking, LDL >=190 mg/dL or other independent risk factors).   If CAC is 1 to 99, it is reasonable to initiate statin  therapy for patients >=13 years of age.   If CAC is >=100 or >=75th percentile, it is reasonable to initiate statin therapy at any age.   Cardiology referral should be considered for patients with CAC scores >=400 or >=75th percentile.   *2018 AHA/ACC/AACVPR/AAPA/ABC/ACPM/ADA/AGS/APhA/ASPC/NLA/PCNA Guideline on the Management of Blood Cholesterol: A Report of the American College of Cardiology/American Heart Association Task Force on Clinical Practice Guidelines. J Am Coll Cardiol. 2019;73(24):3168-3209.   Stephanie  Rosario     Electronically Signed   By: Rudean Haskell MD   On: 08/14/2020 15:20     CT ANGIOGRAPHY CHEST, ABDOMEN AND PELVIS   TECHNIQUE: Multidetector CT imaging through the chest, abdomen and pelvis was performed using the standard protocol during bolus administration of intravenous contrast. Multiplanar reconstructed images and MIPs were obtained and reviewed to evaluate the vascular anatomy.   CONTRAST:  52m OMNIPAQUE IOHEXOL 350 MG/ML SOLN   COMPARISON:  No priors.   FINDINGS: CTA CHEST FINDINGS   Cardiovascular: Heart size is normal. There is no significant pericardial fluid, thickening or pericardial calcification. There is aortic atherosclerosis, as well as atherosclerosis of the great vessels of the mediastinum and the coronary arteries, including calcified atherosclerotic plaque in the left anterior descending coronary artery. Severe thickening calcification of the aortic valve. Ectasia of ascending thoracic aorta (4.0 cm in diameter). In the anterior mediastinum (axial image 60 of series 6) there is a well-defined 2.7 x 1.5 cm low-attenuation lesion anterior to the ascending thoracic aorta, favored to represent  a small pericardial cyst.   Mediastinum/Lymph Nodes: No pathologically enlarged mediastinal or hilar lymph nodes. Esophagus is unremarkable in appearance. No axillary lymphadenopathy.   Lungs/Pleura: No suspicious  appearing pulmonary nodules or masses are noted. No acute consolidative airspace disease. No pleural effusions.   Musculoskeletal/Soft Tissues: Hypoplastic left fourth rib. There are no aggressive appearing lytic or blastic lesions noted in the visualized portions of the skeleton.   CTA ABDOMEN AND PELVIS FINDINGS   Hepatobiliary: Diffuse low attenuation throughout the hepatic parenchyma, indicative of hepatic steatosis. The contour of the liver is slightly nodular, suggesting early changes of cirrhosis. No suspicious cystic or solid hepatic lesions. No intra or extrahepatic biliary ductal dilatation. Gallbladder is normal in appearance.   Pancreas: No pancreatic mass. No pancreatic ductal dilatation. No pancreatic or peripancreatic fluid collections or inflammatory changes.   Spleen: Unremarkable.   Adrenals/Urinary Tract: Nonobstructive calculi in the collecting systems of both kidneys measuring up to 1.5 x 0.7 cm in the right renal pelvis. Numerous subcentimeter low-attenuation lesions in both kidneys, too small to characterize, but statistically likely to represent tiny cysts. Mild multifocal cortical thinning and parenchymal atrophy in the kidneys bilaterally (right greater than left), presumably chronic post infectious or inflammatory scarring. Bilateral adrenal glands are normal in appearance. No hydroureteronephrosis. Urinary bladder is normal in appearance.   Stomach/Bowel: The appearance of the stomach is normal. No pathologic dilatation of small bowel or colon. Numerous colonic diverticulae are noted, without surrounding inflammatory changes to suggest an acute diverticulitis at this time. Normal appendix.   Vascular/Lymphatic: Aortic atherosclerosis, without evidence of aneurysm or dissection in the abdominal or pelvic vasculature. Vascular findings and measurements pertinent to potential TAVR procedure, as detailed below. No lymphadenopathy noted in the abdomen  or pelvis.   Reproductive: Bilateral tubal ligation clips are incidentally noted. Uterus and ovaries are otherwise unremarkable in appearance.   Other: No significant volume of ascites.  No pneumoperitoneum.   Musculoskeletal: There are no aggressive appearing lytic or blastic lesions noted in the visualized portions of the skeleton.   VASCULAR MEASUREMENTS PERTINENT TO TAVR:   AORTA:   Minimal Aortic Diameter-12 x 10 mm   Severity of Aortic Calcification-mild   RIGHT PELVIS:   Right Common Iliac Artery -   Minimal Diameter-8.6 x 9.5 mm   Tortuosity-mild   Calcification-mild   Right External Iliac Artery -   Minimal Diameter-7.2 x 6.9 mm   Tortuosity-moderate to severe   Calcification-none   Right Common Femoral Artery -   Minimal Diameter-7.3 x 6.6 mm   Tortuosity-mild   Calcification-none   LEFT PELVIS:   Left Common Iliac Artery -   Minimal Diameter-8.3 x 8.3 mm   Tortuosity-mild   Calcification-mild   Left External Iliac Artery -   Minimal Diameter-7.9 x 7.3 mm   Tortuosity-moderate to severe   Calcification-none   Left Common Femoral Artery -   Minimal Diameter-6.5 x 6.7 mm   Tortuosity-mild   Calcification-minimal   Review of the MIP images confirms the above findings.   IMPRESSION: 1. Vascular findings and measurements pertinent to potential TAVR procedure, as detailed above. 2. Severe thickening calcification of the aortic valve. 3. Aortic atherosclerosis, in addition to left anterior descending coronary artery disease. There is also ectasia of ascending thoracic aorta (4.0 cm in diameter). Recommend annual imaging followup by CTA or MRA. This recommendation follows 2010 ACCF/AHA/AATS/ACR/ASA/SCA/SCAI/SIR/STS/SVM Guidelines for the Diagnosis and Management of Patients with Thoracic Aortic Disease. Circulation. 2010; 121: N235-T732. Aortic aneurysm NOS (ICD10-I71.9). 4. Hepatic  steatosis with evidence of developing  cirrhosis. 5. Nonobstructive calculi in the collecting systems of both kidneys measuring up to 1.5 x 0.7 cm in the right renal pelvis. 6. Colonic diverticulosis without evidence of acute diverticulitis at this time. 7. Additional incidental findings, as above.     Electronically Signed   By: Vinnie Langton M.D.   On: 08/12/2020 15:09   _______________________________  Procedure: Isolated AVR  Risk of Mortality:  2.826%  Renal Failure:  1.717%  Permanent Stroke:  3.109%  Prolonged Ventilation:  7.083%  DSW Infection:  0.080%  Reoperation:  3.271%  Morbidity or Mortality:  11.134%  Short Length of Stay:  33.340%  Long Length of Stay:  5.262%     Impression:  Patient has stage D1 severe symptomatic aortic stenosis.  She describes classical symptoms of progressive exertional shortness of breath and chest discomfort over the past 2 to 3 years with recent symptoms consistent with chronic diastolic congestive heart failure and angina pectoris, New York Heart Association functional class III.  In addition, the patient has had several episodes of severe dizziness and near syncope all in association with physical exertion.  I have personally reviewed the patient's recent transthoracic echocardiogram, diagnostic cardiac catheterization, EKG, and CT angiograms.  Echocardiogram demonstrates the presence of Sievers type I bicuspid aortic valve with critical aortic stenosis.  There is a single raphae with moderate calcification but severely restricted leaflet mobility.  Peak velocity across aortic valve measured greater than 5.5 m/s corresponding to mean transvalvular gradient estimated 83 mmHg and aortic valve area calculated only 0.41 cm by VTI.  Left ventricular systolic function remains hyperdynamic but there is moderate to severe left ventricular hypertrophy.  Diagnostic cardiac catheterization confirmed the presence of severe aortic stenosis with peak to peak and mean transvalvular  gradients measured 110 and 64 mmHg, respectively, corresponding to aortic valve area calculated only 0.46 cm.  There was nonobstructive coronary artery disease.  EKG reveals sinus rhythm with no significant AV conduction delay.  Cardiac-gated CTA of the heart reveals anatomical characteristics consistent with aortic stenosis suitable for treatment by transcatheter aortic valve replacement without any significant complicating feature and CTA of the aorta and iliac vessels demonstrate what appears to be adequate pelvic vascular access to facilitate a transfemoral approach other than mild narrowing at the site of previous repair of coarctation.  I agree the patient needs aortic valve replacement as soon as practical.  Risks associated with conventional surgery would be slightly elevated because of the patient's advanced age.  She appears to be reasonably good candidate for transcatheter aortic valve replacement without any significant complicating features.    Plan:  The patient and her husband were counseled at length regarding treatment alternatives for management of severe symptomatic aortic stenosis. Alternative approaches such as conventional aortic valve replacement, transcatheter aortic valve replacement, and continued medical therapy without intervention were compared and contrasted at length.  The risks associated with conventional surgical aortic valve replacement were discussed in detail, as were expectations for post-operative convalescence, and why I would favor transcatheter aortic valve replacement over conventional surgery.  Issues specific to transcatheter aortic valve replacement were discussed including questions about long term valve durability, the potential for paravalvular leak, possible increased risk of need for permanent pacemaker placement, and other technical complications related to the procedure itself.  Long-term prognosis with medical therapy was discussed. This discussion was  placed in the context of the patient's own specific clinical presentation and past medical history.  All of their  questions have been addressed.  The patient desires to proceed with transcatheter aortic valve replacement as soon as practical.  We tentatively plan to proceed with surgery on August 18, 2020.  We discussed the technical aspects of transcatheter aortic valve replacement including what types of management strategies would be attempted intraoperatively in the event of life-threatening complications, including whether or not the patient would be considered a candidate for the use of cardiopulmonary bypass and/or conversion to open sternotomy for attempted surgical intervention.  The patient has been advised of a variety of complications that might develop including but not limited to risks of death, stroke, paravalvular leak, aortic dissection or other major vascular complications, aortic annulus rupture, device embolization, cardiac rupture or perforation, mitral regurgitation, acute myocardial infarction, arrhythmia, heart block or bradycardia requiring permanent pacemaker placement, congestive heart failure, respiratory failure, renal failure, pneumonia, infection, other late complications related to structural valve deterioration or migration, valve thrombosis or infection (endocarditis), or other complications that might ultimately cause a temporary or permanent loss of functional independence or other long term morbidity.  The patient provides full informed consent for the procedure as planned and all questions were answered.       I spent in excess of 90 minutes during the conduct of this office consultation and >50% of this time involved direct face-to-face encounter with the patient for counseling and/or coordination of their care.    Valentina Gu. Roxy Manns, MD 08/14/2020 4:29 PM

## 2020-08-14 NOTE — Patient Instructions (Signed)
   Continue taking all current medications without change through the day before surgery.  Make sure to bring all of your medications with you when you come for your Pre-Admission Testing appointment at [facility] Short-Stay Department.  Have nothing to eat or drink after midnight the night before surgery.  On the morning of surgery do not take any medications other than use your eye drops  At your appointment for Pre-Admission Testing at the [facility] Short-Stay Department you will be asked to sign permission forms for your upcoming surgery.  By definition your signature on these forms implies that you and/or your designee provide full informed consent for your planned surgical procedure(s), that alternative treatment options have been discussed, that you understand and accept any and all potential risks, and that you have some understanding of what to expect for your post-operative convalescence.  For any major cardiac surgical procedure potential operative risks include but are not limited to at least some risk of death, stroke or other neurologic complication, myocardial infarction, congestive heart failure, respiratory failure, renal failure, bleeding requiring blood transfusion and/or reexploration, irregular heart rhythm, heart block or bradycardia requiring permanent pacemaker, pneumonia, pericardial effusion, pleural effusion, wound infection, pulmonary embolus or other thromboembolic complication, chronic pain, or other complications related to the specific procedure(s) performed.  For transcatheter aortic valve replacement additional risks include but are not limited to risk of paravalvular leak, valve embolization, valve thrombosis, aortic dissection, aortic rupture, ventricular septal defect or perforation, pericardial tamponade, injury of the abdominal aorta or its branches, and/or injury or occlusion of the arteries going to your arms or legs.  Please  call to schedule a follow-up appointment in our office prior to surgery if you have any unresolved questions about your planned surgical procedure, the associated risks, alternative treatment options, and/or expectations for your post-operative recovery.

## 2020-08-17 ENCOUNTER — Other Ambulatory Visit: Payer: Self-pay

## 2020-08-17 ENCOUNTER — Encounter (HOSPITAL_COMMUNITY)
Admission: RE | Admit: 2020-08-17 | Discharge: 2020-08-17 | Disposition: A | Discharge status: Home / Self Care | Payer: Medicare Other | Source: Ambulatory Visit | Attending: Cardiovascular Disease | Admitting: Cardiovascular Disease

## 2020-08-17 ENCOUNTER — Encounter (HOSPITAL_COMMUNITY): Payer: Self-pay

## 2020-08-17 ENCOUNTER — Ambulatory Visit (HOSPITAL_COMMUNITY): Admission: RE | Admit: 2020-08-17 | Payer: Medicare Other | Source: Ambulatory Visit

## 2020-08-17 DIAGNOSIS — I350 Nonrheumatic aortic (valve) stenosis: Secondary | ICD-10-CM | POA: Insufficient documentation

## 2020-08-17 DIAGNOSIS — Z01818 Encounter for other preprocedural examination: Secondary | ICD-10-CM | POA: Diagnosis not present

## 2020-08-17 DIAGNOSIS — Z01811 Encounter for preprocedural respiratory examination: Secondary | ICD-10-CM

## 2020-08-17 LAB — URINALYSIS, ROUTINE W REFLEX MICROSCOPIC
Bacteria, UA: NONE SEEN
Bilirubin Urine: NEGATIVE
Glucose, UA: NEGATIVE mg/dL
Ketones, ur: NEGATIVE mg/dL
Leukocytes,Ua: NEGATIVE
Nitrite: NEGATIVE
Protein, ur: NEGATIVE mg/dL
Specific Gravity, Urine: 1.006 (ref 1.005–1.030)
pH: 6 (ref 5.0–8.0)

## 2020-08-17 LAB — SURGICAL PCR SCREEN
MRSA, PCR: NEGATIVE
Staphylococcus aureus: NEGATIVE

## 2020-08-17 NOTE — Progress Notes (Signed)
PCP - Dr. Leonor Liv Cardiologist - Dr. Dulce Sellar  Chest x-ray -  EKG - 07/24/20 Stress Test -  ECHO - 07/30/20 Cardiac Cath - 08/05/20  Sleep Study - yes CPAP - yes  COVID TEST- yes   Anesthesia review: yes  Patient denies shortness of breath, fever, cough and chest pain at PAT appointment   All instructions explained to the patient, with a verbal understanding of the material. Patient agrees to go over the instructions while at home for a better understanding. Patient also instructed to self quarantine after being tested for COVID-19. The opportunity to ask questions was provided.

## 2020-08-17 NOTE — Progress Notes (Signed)
Your procedure is scheduled on Tuesday August 18, 2020.  Report to [facility] Main Entrance "A" at 09:15 A.M., and check in at the Admitting office.  Call this number if you have problems the morning of surgery: [phone]  Call [phone] if you have any questions prior to your surgery date Monday-Friday 8am-4pm   Remember: Do not eat or drink after midnight the night before your surgery  Do not take any medications morning of surgery    As of today, STOP taking any Aspirin (unless otherwise instructed by your surgeon), Aleve, Naproxen, Ibuprofen, Motrin, Advil, Goody's, BC's, all herbal medications, fish oil, and all vitamins.    The Morning of Surgery  Do not wear jewelry, make-up or nail polish.  Do not wear lotions, powders, or perfumes, or deodorant  Do not shave 48 hours prior to surgery.    Do not bring valuables to the hospital.   [facility] is not responsible for any belongings or valuables.  If you are a smoker, DO NOT Smoke 24 hours prior to surgery  If you wear a CPAP at night please bring your mask the morning of surgery   Remember that you must have someone to transport you home after your surgery, and remain with you for 24 hours if you are discharged the same day.   Please bring cases for contacts, glasses, hearing aids, dentures or bridgework because it cannot be worn into surgery.    Leave your suitcase in the car.  After surgery it may be brought to your room.  For patients admitted to the hospital, discharge time will be determined by your treatment team.  Patients discharged the day of surgery will not be allowed to drive home.    Special instructions:   [place]- Preparing For Surgery  Before surgery, you can play an important role. Because skin is not sterile, your skin needs to be as free of germs as possible. You can reduce the number of germs on your skin by washing with CHG (chlorahexidine gluconate) Soap before surgery.  CHG is an  antiseptic cleaner which kills germs and bonds with the skin to continue killing germs even after washing.    Oral Hygiene is also important to reduce your risk of infection.  Remember - BRUSH YOUR TEETH THE MORNING OF SURGERY WITH YOUR REGULAR TOOTHPASTE  Please do not use if you have an allergy to CHG or antibacterial soaps. If your skin becomes reddened/irritated stop using the CHG.  Do not shave (including legs and underarms) for at least 48 hours prior to first CHG shower. It is OK to shave your face.  Please follow these instructions carefully.   Shower the NIGHT BEFORE SURGERY and the MORNING OF SURGERY with CHG Soap.   If you chose to wash your hair and body, wash as usual with your normal shampoo and body-wash/soap.  Rinse your hair and body thoroughly to remove the shampoo and soap.  Apply CHG directly to the skin (ONLY FROM THE NECK DOWN) and wash gently with a scrungie or a clean washcloth.   Do not use on open wounds or open sores. Avoid contact with your eyes, ears, mouth and genitals (private parts). Wash Face and genitals (private parts)  with your normal soap.   Wash thoroughly, paying special attention to the area where your surgery will be performed.  Thoroughly rinse your body with warm water from the neck down.  DO NOT shower/wash with your normal soap after using and rinsing  off the CHG Soap.  Pat yourself dry with a CLEAN TOWEL.  Wear CLEAN PAJAMAS to bed the night before surgery  Place CLEAN SHEETS on your bed the night of your first shower and DO NOT SLEEP WITH PETS.  Wear comfortable clothes the morning of surgery.     Day of Surgery:  Please shower the morning of surgery with the CHG soap Do not apply any deodorants/lotions. Please wear clean clothes to the hospital/surgery center.   Remember to brush your teeth WITH YOUR REGULAR TOOTHPASTE.   Please read over the following fact sheets that you were given.

## 2020-08-18 ENCOUNTER — Other Ambulatory Visit: Payer: Self-pay

## 2020-08-18 DIAGNOSIS — I350 Nonrheumatic aortic (valve) stenosis: Secondary | ICD-10-CM

## 2020-08-20 MED ORDER — VANCOMYCIN HCL 1250 MG/250ML IV SOLN
1250.0000 mg | Freq: Once | INTRAVENOUS | Status: DC
  Filled 2020-08-20 (×2): qty 250

## 2020-08-20 MED ORDER — POTASSIUM CHLORIDE 2 MEQ/ML IV SOLN
80.0000 meq | INTRAVENOUS | Status: DC
  Filled 2020-08-20: qty 40

## 2020-08-20 MED ORDER — DEXMEDETOMIDINE HCL IN NACL 400 MCG/100ML IV SOLN
0.1000 ug/kg/h | INTRAVENOUS | Status: AC
  Administered 2020-08-21: 1 ug/kg/h via INTRAVENOUS
  Filled 2020-08-20: qty 100

## 2020-08-20 MED ORDER — CEFAZOLIN SODIUM-DEXTROSE 2-4 GM/100ML-% IV SOLN
2.0000 g | INTRAVENOUS | Status: AC
  Administered 2020-08-21: 2 g via INTRAVENOUS
  Filled 2020-08-20 (×2): qty 100

## 2020-08-20 MED ORDER — SODIUM CHLORIDE 0.9 % IV SOLN
INTRAVENOUS | Status: DC
  Filled 2020-08-20: qty 30

## 2020-08-20 MED ORDER — MAGNESIUM SULFATE 50 % IJ SOLN
40.0000 meq | INTRAMUSCULAR | Status: DC
  Filled 2020-08-20: qty 9.85

## 2020-08-20 MED ORDER — NOREPINEPHRINE 4 MG/250ML-% IV SOLN
0.0000 ug/min | INTRAVENOUS | Status: DC
  Filled 2020-08-20: qty 250

## 2020-08-21 ENCOUNTER — Other Ambulatory Visit: Payer: Self-pay

## 2020-08-21 ENCOUNTER — Inpatient Hospital Stay (HOSPITAL_COMMUNITY): Payer: Medicare Other

## 2020-08-21 ENCOUNTER — Inpatient Hospital Stay (HOSPITAL_COMMUNITY)
Admission: RE | Admit: 2020-08-21 | Discharge: 2020-08-21 | Disposition: A | Discharge status: Home / Self Care | Payer: Medicare Other | Source: Ambulatory Visit | Attending: Physician Assistant | Admitting: Physician Assistant

## 2020-08-21 ENCOUNTER — Inpatient Hospital Stay (HOSPITAL_COMMUNITY): Payer: Medicare Other | Admitting: Physician Assistant

## 2020-08-21 ENCOUNTER — Encounter (HOSPITAL_COMMUNITY): Payer: Self-pay | Admitting: Cardiovascular Disease

## 2020-08-21 ENCOUNTER — Other Ambulatory Visit (HOSPITAL_COMMUNITY): Payer: Medicare Other

## 2020-08-21 ENCOUNTER — Inpatient Hospital Stay (HOSPITAL_COMMUNITY)
Admission: RE | Admit: 2020-08-21 | Discharge: 2020-08-22 | DRG: 267 | Disposition: A | Discharge status: Home / Self Care | Payer: Medicare Other | Attending: Surgery | Admitting: Surgery

## 2020-08-21 ENCOUNTER — Encounter (HOSPITAL_COMMUNITY)
Admission: RE | Disposition: A | Discharge status: Home / Self Care | Payer: Self-pay | Source: Home / Self Care | Attending: Surgery

## 2020-08-21 DIAGNOSIS — Z9989 Dependence on other enabling machines and devices: Secondary | ICD-10-CM | POA: Diagnosis not present

## 2020-08-21 DIAGNOSIS — Z79899 Other long term (current) drug therapy: Secondary | ICD-10-CM | POA: Diagnosis not present

## 2020-08-21 DIAGNOSIS — Z8774 Personal history of (corrected) congenital malformations of heart and circulatory system: Secondary | ICD-10-CM

## 2020-08-21 DIAGNOSIS — Z952 Presence of prosthetic heart valve: Secondary | ICD-10-CM | POA: Diagnosis not present

## 2020-08-21 DIAGNOSIS — I5032 Chronic diastolic (congestive) heart failure: Secondary | ICD-10-CM | POA: Diagnosis present

## 2020-08-21 DIAGNOSIS — E7800 Pure hypercholesterolemia, unspecified: Secondary | ICD-10-CM | POA: Diagnosis present

## 2020-08-21 DIAGNOSIS — Z006 Encounter for examination for normal comparison and control in clinical research program: Secondary | ICD-10-CM

## 2020-08-21 DIAGNOSIS — Z882 Allergy status to sulfonamides status: Secondary | ICD-10-CM | POA: Diagnosis not present

## 2020-08-21 DIAGNOSIS — I10 Essential (primary) hypertension: Secondary | ICD-10-CM | POA: Diagnosis not present

## 2020-08-21 DIAGNOSIS — G4733 Obstructive sleep apnea (adult) (pediatric): Secondary | ICD-10-CM | POA: Diagnosis present

## 2020-08-21 DIAGNOSIS — Z20822 Contact with and (suspected) exposure to covid-19: Secondary | ICD-10-CM | POA: Diagnosis present

## 2020-08-21 DIAGNOSIS — I350 Nonrheumatic aortic (valve) stenosis: Secondary | ICD-10-CM | POA: Diagnosis not present

## 2020-08-21 DIAGNOSIS — Q231 Congenital insufficiency of aortic valve: Secondary | ICD-10-CM

## 2020-08-21 DIAGNOSIS — I110 Hypertensive heart disease with heart failure: Secondary | ICD-10-CM | POA: Diagnosis not present

## 2020-08-21 DIAGNOSIS — Z954 Presence of other heart-valve replacement: Secondary | ICD-10-CM | POA: Diagnosis not present

## 2020-08-21 DIAGNOSIS — J9811 Atelectasis: Secondary | ICD-10-CM | POA: Diagnosis not present

## 2020-08-21 DIAGNOSIS — Z8249 Family history of ischemic heart disease and other diseases of the circulatory system: Secondary | ICD-10-CM | POA: Diagnosis not present

## 2020-08-21 DIAGNOSIS — Z01811 Encounter for preprocedural respiratory examination: Secondary | ICD-10-CM

## 2020-08-21 DIAGNOSIS — Z01818 Encounter for other preprocedural examination: Secondary | ICD-10-CM | POA: Diagnosis not present

## 2020-08-21 DIAGNOSIS — H401133 Primary open-angle glaucoma, bilateral, severe stage: Secondary | ICD-10-CM | POA: Diagnosis present

## 2020-08-21 DIAGNOSIS — I2510 Atherosclerotic heart disease of native coronary artery without angina pectoris: Secondary | ICD-10-CM | POA: Diagnosis present

## 2020-08-21 DIAGNOSIS — E785 Hyperlipidemia, unspecified: Secondary | ICD-10-CM | POA: Diagnosis present

## 2020-08-21 DIAGNOSIS — G4730 Sleep apnea, unspecified: Secondary | ICD-10-CM | POA: Diagnosis not present

## 2020-08-21 HISTORY — DX: Nonrheumatic aortic (valve) stenosis: I35.0

## 2020-08-21 HISTORY — PX: TRANSCATHETER AORTIC VALVE REPLACEMENT, TRANSFEMORAL: SHX6400

## 2020-08-21 HISTORY — PX: INTRAOPERATIVE TRANSTHORACIC ECHOCARDIOGRAM: SHX6523

## 2020-08-21 LAB — POCT I-STAT, CHEM 8
BUN: 28 mg/dL — ABNORMAL HIGH (ref 8–23)
Calcium, Ion: 1.34 mmol/L (ref 1.15–1.40)
Chloride: 111 mmol/L (ref 98–111)
Creatinine, Ser: 1.1 mg/dL — ABNORMAL HIGH (ref 0.44–1.00)
Glucose, Bld: 123 mg/dL — ABNORMAL HIGH (ref 70–99)
HCT: 30 % — ABNORMAL LOW (ref 36.0–46.0)
Hemoglobin: 10.2 g/dL — ABNORMAL LOW (ref 12.0–15.0)
Potassium: 4 mmol/L (ref 3.5–5.1)
Sodium: 142 mmol/L (ref 135–145)
TCO2: 20 mmol/L — ABNORMAL LOW (ref 22–32)

## 2020-08-21 LAB — BLOOD GAS, ARTERIAL
Acid-base deficit: 5.7 mmol/L — ABNORMAL HIGH (ref 0.0–2.0)
Bicarbonate: 19.1 mmol/L — ABNORMAL LOW (ref 20.0–28.0)
Drawn by: 602861
FIO2: 21
O2 Saturation: 98.7 %
Patient temperature: 37
pCO2 arterial: 37.2 mmHg (ref 32.0–48.0)
pH, Arterial: 7.33 — ABNORMAL LOW (ref 7.350–7.450)
pO2, Arterial: 121 mmHg — ABNORMAL HIGH (ref 83.0–108.0)

## 2020-08-21 LAB — COMPREHENSIVE METABOLIC PANEL
ALT: 15 U/L (ref 0–44)
AST: 14 U/L — ABNORMAL LOW (ref 15–41)
Albumin: 3.7 g/dL (ref 3.5–5.0)
Alkaline Phosphatase: 66 U/L (ref 38–126)
Anion gap: 8 (ref 5–15)
BUN: 35 mg/dL — ABNORMAL HIGH (ref 8–23)
CO2: 20 mmol/L — ABNORMAL LOW (ref 22–32)
Calcium: 9.4 mg/dL (ref 8.9–10.3)
Chloride: 112 mmol/L — ABNORMAL HIGH (ref 98–111)
Creatinine, Ser: 1.34 mg/dL — ABNORMAL HIGH (ref 0.44–1.00)
GFR, Estimated: 40 mL/min — ABNORMAL LOW (ref >60)
Glucose, Bld: 122 mg/dL — ABNORMAL HIGH (ref 70–99)
Potassium: 4 mmol/L (ref 3.5–5.1)
Sodium: 140 mmol/L (ref 135–145)
Total Bilirubin: 0.8 mg/dL (ref 0.3–1.2)
Total Protein: 6.2 g/dL — ABNORMAL LOW (ref 6.5–8.1)

## 2020-08-21 LAB — PROTIME-INR
INR: 0.9 (ref 0.8–1.2)
Prothrombin Time: 12.3 seconds (ref 11.4–15.2)

## 2020-08-21 LAB — CBC
HCT: 37.4 % (ref 36.0–46.0)
Hemoglobin: 11.8 g/dL — ABNORMAL LOW (ref 12.0–15.0)
MCH: 31 pg (ref 26.0–34.0)
MCHC: 31.6 g/dL (ref 30.0–36.0)
MCV: 98.2 fL (ref 80.0–100.0)
Platelets: 165 10*3/uL (ref 150–400)
RBC: 3.81 MIL/uL — ABNORMAL LOW (ref 3.87–5.11)
RDW: 13.6 % (ref 11.5–15.5)
WBC: 8.5 10*3/uL (ref 4.0–10.5)
nRBC: 0 % (ref 0.0–0.2)

## 2020-08-21 LAB — ECHOCARDIOGRAM LIMITED
AR max vel: 2.47 cm2
AV Area VTI: 2.45 cm2
AV Area mean vel: 2.47 cm2
AV Mean grad: 5 mmHg
AV Peak grad: 8.6 mmHg
Ao pk vel: 1.47 m/s

## 2020-08-21 LAB — ABO/RH: ABO/RH(D): B NEG

## 2020-08-21 LAB — HEMOGLOBIN A1C
Hgb A1c MFr Bld: 5.8 % — ABNORMAL HIGH (ref 4.8–5.6)
Mean Plasma Glucose: 119.76 mg/dL

## 2020-08-21 LAB — TYPE AND SCREEN
ABO/RH(D): B NEG
Antibody Screen: NEGATIVE

## 2020-08-21 LAB — SARS CORONAVIRUS 2 BY RT PCR (HOSPITAL ORDER, PERFORMED IN ~~LOC~~ HOSPITAL LAB): SARS Coronavirus 2: NEGATIVE

## 2020-08-21 SURGERY — IMPLANTATION, AORTIC VALVE, TRANSCATHETER, FEMORAL APPROACH
Anesthesia: Monitor Anesthesia Care | Site: Chest

## 2020-08-21 MED ORDER — PROPOFOL 500 MG/50ML IV EMUL
INTRAVENOUS | Status: DC | PRN
  Administered 2020-08-21: 10 ug/kg/min via INTRAVENOUS

## 2020-08-21 MED ORDER — TRAMADOL HCL 50 MG PO TABS: 50.0000 mg | ORAL_TABLET | ORAL | Status: DC | PRN

## 2020-08-21 MED ORDER — SODIUM CHLORIDE 0.9 % IV SOLN
INTRAVENOUS | Status: DC | PRN
  Administered 2020-08-21: 500 mL

## 2020-08-21 MED ORDER — SODIUM CHLORIDE 0.9% FLUSH
3.0000 mL | Freq: Two times a day (BID) | INTRAVENOUS | Status: DC
  Administered 2020-08-21 – 2020-08-22 (×2): 3 mL via INTRAVENOUS

## 2020-08-21 MED ORDER — LATANOPROST 0.005 % OP SOLN
1.0000 [drp] | Freq: Every day | OPHTHALMIC | Status: DC
  Administered 2020-08-21: 1 [drp] via OPHTHALMIC
  Filled 2020-08-21: qty 2.5

## 2020-08-21 MED ORDER — NITROGLYCERIN IN D5W 200-5 MCG/ML-% IV SOLN: 0.0000 ug/min | INTRAVENOUS | Status: DC

## 2020-08-21 MED ORDER — SODIUM CHLORIDE 0.9 % IV SOLN: INTRAVENOUS | Status: AC

## 2020-08-21 MED ORDER — TRIAMTERENE-HCTZ 37.5-25 MG PO TABS
0.5000 | ORAL_TABLET | Freq: Every day | ORAL | Status: DC
  Administered 2020-08-22: 0.5 via ORAL
  Filled 2020-08-21: qty 1

## 2020-08-21 MED ORDER — ACETAMINOPHEN 650 MG RE SUPP: 650.0000 mg | Freq: Four times a day (QID) | RECTAL | Status: DC | PRN

## 2020-08-21 MED ORDER — PROTAMINE SULFATE 10 MG/ML IV SOLN
INTRAVENOUS | Status: DC | PRN
  Administered 2020-08-21: 10 mg via INTRAVENOUS
  Administered 2020-08-21 (×2): 50 mg via INTRAVENOUS

## 2020-08-21 MED ORDER — PHENYLEPHRINE HCL-NACL 20-0.9 MG/250ML-% IV SOLN
0.0000 ug/min | INTRAVENOUS | Status: DC
  Filled 2020-08-21: qty 250

## 2020-08-21 MED ORDER — CHLORHEXIDINE GLUCONATE 4 % EX LIQD: 30.0000 mL | CUTANEOUS | Status: DC

## 2020-08-21 MED ORDER — SODIUM CHLORIDE 0.9 % IV SOLN
INTRAVENOUS | Status: AC
  Filled 2020-08-21 (×3): qty 1.2

## 2020-08-21 MED ORDER — SODIUM CHLORIDE 0.9 % IV SOLN: 250.0000 mL | INTRAVENOUS | Status: DC | PRN

## 2020-08-21 MED ORDER — CEFAZOLIN SODIUM-DEXTROSE 2-4 GM/100ML-% IV SOLN
2.0000 g | Freq: Three times a day (TID) | INTRAVENOUS | Status: AC
  Administered 2020-08-21 – 2020-08-22 (×2): 2 g via INTRAVENOUS
  Filled 2020-08-21 (×2): qty 100

## 2020-08-21 MED ORDER — LIDOCAINE HCL 1 % IJ SOLN
INTRAMUSCULAR | Status: DC | PRN
  Administered 2020-08-21: 19 mL via INTRADERMAL

## 2020-08-21 MED ORDER — CLOPIDOGREL BISULFATE 75 MG PO TABS
75.0000 mg | ORAL_TABLET | Freq: Every day | ORAL | Status: DC
  Administered 2020-08-22: 75 mg via ORAL
  Filled 2020-08-21: qty 1

## 2020-08-21 MED ORDER — FENTANYL CITRATE (PF) 250 MCG/5ML IJ SOLN
INTRAMUSCULAR | Status: DC | PRN
  Administered 2020-08-21: 50 ug via INTRAVENOUS

## 2020-08-21 MED ORDER — FENTANYL CITRATE (PF) 250 MCG/5ML IJ SOLN
INTRAMUSCULAR | Status: AC
  Filled 2020-08-21: qty 5

## 2020-08-21 MED ORDER — BISOPROLOL-HYDROCHLOROTHIAZIDE 10-6.25 MG PO TABS
0.5000 | ORAL_TABLET | Freq: Every day | ORAL | Status: DC
  Administered 2020-08-22: 0.5 via ORAL
  Filled 2020-08-21: qty 1

## 2020-08-21 MED ORDER — BRIMONIDINE TARTRATE 0.2 % OP SOLN
1.0000 [drp] | Freq: Three times a day (TID) | OPHTHALMIC | Status: DC
  Administered 2020-08-21 – 2020-08-22 (×3): 1 [drp] via OPHTHALMIC
  Filled 2020-08-21: qty 5

## 2020-08-21 MED ORDER — CHLORHEXIDINE GLUCONATE 4 % EX LIQD: 60.0000 mL | Freq: Once | CUTANEOUS | Status: DC

## 2020-08-21 MED ORDER — HEPARIN SODIUM (PORCINE) 1000 UNIT/ML IJ SOLN
INTRAMUSCULAR | Status: DC | PRN
  Administered 2020-08-21: 11000 [IU] via INTRAVENOUS

## 2020-08-21 MED ORDER — OXYCODONE HCL 5 MG PO TABS
5.0000 mg | ORAL_TABLET | ORAL | Status: DC | PRN
  Administered 2020-08-22: 5 mg via ORAL
  Filled 2020-08-21: qty 1

## 2020-08-21 MED ORDER — LIDOCAINE HCL (PF) 1 % IJ SOLN
INTRAMUSCULAR | Status: AC
  Filled 2020-08-21: qty 30

## 2020-08-21 MED ORDER — ACETAMINOPHEN 325 MG PO TABS: 650.0000 mg | ORAL_TABLET | Freq: Four times a day (QID) | ORAL | Status: DC | PRN

## 2020-08-21 MED ORDER — RAMIPRIL 5 MG PO CAPS
5.0000 mg | ORAL_CAPSULE | Freq: Every day | ORAL | Status: DC
  Administered 2020-08-22: 5 mg via ORAL
  Filled 2020-08-21: qty 1

## 2020-08-21 MED ORDER — METOPROLOL TARTRATE 5 MG/5ML IV SOLN: 2.5000 mg | INTRAVENOUS | Status: DC | PRN

## 2020-08-21 MED ORDER — MORPHINE SULFATE (PF) 2 MG/ML IV SOLN: 1.0000 mg | INTRAVENOUS | Status: DC | PRN

## 2020-08-21 MED ORDER — ONDANSETRON HCL 4 MG/2ML IJ SOLN
4.0000 mg | Freq: Four times a day (QID) | INTRAMUSCULAR | Status: DC | PRN
  Administered 2020-08-21: 4 mg via INTRAVENOUS
  Filled 2020-08-21: qty 2

## 2020-08-21 MED ORDER — SODIUM CHLORIDE 0.9% FLUSH: 3.0000 mL | INTRAVENOUS | Status: DC | PRN

## 2020-08-21 MED ORDER — ASPIRIN 81 MG PO CHEW
81.0000 mg | CHEWABLE_TABLET | Freq: Every day | ORAL | Status: DC
  Administered 2020-08-22: 81 mg via ORAL
  Filled 2020-08-21: qty 1

## 2020-08-21 MED ORDER — CHLORHEXIDINE GLUCONATE 0.12 % MT SOLN
15.0000 mL | Freq: Once | OROMUCOSAL | Status: AC
  Administered 2020-08-21 – 2020-08-22 (×2): 15 mL via OROMUCOSAL
  Filled 2020-08-21: qty 15

## 2020-08-21 MED ORDER — POLYVINYL ALCOHOL 1.4 % OP SOLN
1.0000 [drp] | OPHTHALMIC | Status: DC | PRN
  Filled 2020-08-21: qty 15

## 2020-08-21 MED ORDER — SODIUM CHLORIDE 0.9 % IV SOLN
INTRAVENOUS | Status: DC
Start: 2020-08-22 — End: 2020-08-21

## 2020-08-21 MED ORDER — NETARSUDIL DIMESYLATE 0.02 % OP SOLN
1.0000 [drp] | Freq: Every day | OPHTHALMIC | Status: DC
  Administered 2020-08-21: 1 [drp] via OPHTHALMIC

## 2020-08-21 MED ORDER — DORZOLAMIDE HCL 2 % OP SOLN
1.0000 [drp] | Freq: Three times a day (TID) | OPHTHALMIC | Status: DC
  Administered 2020-08-21 – 2020-08-22 (×3): 1 [drp] via OPHTHALMIC
  Filled 2020-08-21: qty 10

## 2020-08-21 MED ORDER — HEPARIN SODIUM (PORCINE) 1000 UNIT/ML IJ SOLN
INTRAMUSCULAR | Status: AC
  Filled 2020-08-21: qty 1

## 2020-08-21 MED ORDER — IODIXANOL 320 MG/ML IV SOLN
INTRAVENOUS | Status: DC | PRN
  Administered 2020-08-21: 20 mL

## 2020-08-21 MED ORDER — LACTATED RINGERS IV SOLN: INTRAVENOUS | Status: DC | PRN

## 2020-08-21 MED ORDER — CHLORHEXIDINE GLUCONATE 4 % EX LIQD
60.0000 mL | Freq: Once | CUTANEOUS | Status: DC
Start: 2020-08-21 — End: 2020-08-21

## 2020-08-21 MED ORDER — PROTAMINE SULFATE 10 MG/ML IV SOLN
INTRAVENOUS | Status: AC
  Filled 2020-08-21: qty 10

## 2020-08-21 MED FILL — Vancomycin HCl For IV Soln 10 GM (Base Equivalent): INTRAVENOUS | Qty: 1250 | Status: AC

## 2020-08-21 MED FILL — Heparin Sodium (Porcine) Inj 1000 Unit/ML: INTRAMUSCULAR | Qty: 30 | Status: AC

## 2020-08-21 MED FILL — Magnesium Sulfate Inj 50%: INTRAMUSCULAR | Qty: 10 | Status: AC

## 2020-08-21 MED FILL — Potassium Chloride Inj 2 mEq/ML: INTRAVENOUS | Qty: 40 | Status: AC

## 2020-08-21 SURGICAL SUPPLY — 84 items
BAG DECANTER FOR FLEXI CONT (MISCELLANEOUS) IMPLANT
BAG SNAP BAND KOVER 36X36 (MISCELLANEOUS) ×4 IMPLANT
BALLN TRUE 18X4.5 (BALLOONS) ×3
BALLN TRUE 18X4.5CM (BALLOONS) ×1
BALLOON TRUE 18X4.5 (BALLOONS) ×2 IMPLANT
BLADE CLIPPER SURG (BLADE) IMPLANT
BLADE OSCILLATING /SAGITTAL (BLADE) IMPLANT
BLADE STERNUM SYSTEM 6 (BLADE) IMPLANT
BLADE SURG 10 STRL SS (BLADE) IMPLANT
CABLE ADAPT CONN TEMP 6FT (ADAPTER) ×4 IMPLANT
CATH DIAG EXPO 6F AL1 (CATHETERS) IMPLANT
CATH DIAG EXPO 6F VENT PIG 145 (CATHETERS) ×8 IMPLANT
CATH EXTERNAL FEMALE PUREWICK (CATHETERS) IMPLANT
CATH INFINITI 6F AL2 (CATHETERS) IMPLANT
CATH S G BIP PACING (CATHETERS) ×4 IMPLANT
CHLORAPREP W/TINT 26 (MISCELLANEOUS) ×4 IMPLANT
CLIP VESOCCLUDE MED 24/CT (CLIP) IMPLANT
CLIP VESOCCLUDE SM WIDE 24/CT (CLIP) IMPLANT
CLOSURE MYNX CONTROL 6F/7F (Vascular Products) ×4 IMPLANT
CNTNR URN SCR LID CUP LEK RST (MISCELLANEOUS) ×4 IMPLANT
CONT SPEC 4OZ STRL OR WHT (MISCELLANEOUS) ×8
COVER BACK TABLE 80X110 HD (DRAPES) ×4 IMPLANT
COVER WAND RF STERILE (DRAPES) ×4 IMPLANT
DECANTER SPIKE VIAL GLASS SM (MISCELLANEOUS) ×4 IMPLANT
DERMABOND ADVANCED (GAUZE/BANDAGES/DRESSINGS) ×2
DERMABOND ADVANCED .7 DNX12 (GAUZE/BANDAGES/DRESSINGS) ×2 IMPLANT
DEVICE CLOSURE PERCLS PRGLD 6F (VASCULAR PRODUCTS) ×4 IMPLANT
DRAPE INCISE IOBAN 66X45 STRL (DRAPES) IMPLANT
DRSG TEGADERM 4X4.75 (GAUZE/BANDAGES/DRESSINGS) ×8 IMPLANT
ELECT CAUTERY BLADE 6.4 (BLADE) IMPLANT
ELECT REM PT RETURN 9FT ADLT (ELECTROSURGICAL) ×8
ELECTRODE REM PT RTRN 9FT ADLT (ELECTROSURGICAL) ×4 IMPLANT
FELT TEFLON 6X6 (MISCELLANEOUS) IMPLANT
GAUZE SPONGE 4X4 12PLY STRL (GAUZE/BANDAGES/DRESSINGS) ×4 IMPLANT
GLOVE EUDERMIC 7 POWDERFREE (GLOVE) IMPLANT
GLOVE SURG ENC MOIS LTX SZ7.5 (GLOVE) ×4 IMPLANT
GLOVE SURG ENC MOIS LTX SZ8 (GLOVE) IMPLANT
GLOVE SURG ORTHO LTX SZ7.5 (GLOVE) IMPLANT
GOWN STRL REUS W/ TWL LRG LVL3 (GOWN DISPOSABLE) IMPLANT
GOWN STRL REUS W/ TWL XL LVL3 (GOWN DISPOSABLE) ×2 IMPLANT
GOWN STRL REUS W/TWL LRG LVL3 (GOWN DISPOSABLE)
GOWN STRL REUS W/TWL XL LVL3 (GOWN DISPOSABLE) ×4
GUIDEWIRE SAFE TJ AMPLATZ EXST (WIRE) ×4 IMPLANT
INSERT FOGARTY SM (MISCELLANEOUS) IMPLANT
KIT BASIN OR (CUSTOM PROCEDURE TRAY) ×4 IMPLANT
KIT HEART LEFT (KITS) ×4 IMPLANT
KIT SUCTION CATH 14FR (SUCTIONS) IMPLANT
KIT TURNOVER KIT B (KITS) ×4 IMPLANT
LOOP VESSEL MAXI BLUE (MISCELLANEOUS) IMPLANT
LOOP VESSEL MINI RED (MISCELLANEOUS) IMPLANT
NS IRRIG 1000ML POUR BTL (IV SOLUTION) ×4 IMPLANT
PACK ENDO MINOR (CUSTOM PROCEDURE TRAY) ×4 IMPLANT
PAD ARMBOARD 7.5X6 YLW CONV (MISCELLANEOUS) ×8 IMPLANT
PAD ELECT DEFIB RADIOL ZOLL (MISCELLANEOUS) ×4 IMPLANT
PENCIL BUTTON HOLSTER BLD 10FT (ELECTRODE) IMPLANT
PERCLOSE PROGLIDE 6F (VASCULAR PRODUCTS) ×8
POSITIONER HEAD DONUT 9IN (MISCELLANEOUS) ×4 IMPLANT
SET MICROPUNCTURE 5F STIFF (MISCELLANEOUS) ×4 IMPLANT
SHEATH BRITE TIP 7FR 35CM (SHEATH) ×4 IMPLANT
SHEATH PINNACLE 6F 10CM (SHEATH) ×4 IMPLANT
SHEATH PINNACLE 8F 10CM (SHEATH) ×4 IMPLANT
SLEEVE REPOSITIONING LENGTH 30 (MISCELLANEOUS) ×4 IMPLANT
STOPCOCK MORSE 400PSI 3WAY (MISCELLANEOUS) ×8 IMPLANT
SUT ETHIBOND X763 2 0 SH 1 (SUTURE) IMPLANT
SUT GORETEX CV 4 TH 22 36 (SUTURE) IMPLANT
SUT GORETEX CV4 TH-18 (SUTURE) IMPLANT
SUT MNCRL AB 3-0 PS2 18 (SUTURE) IMPLANT
SUT PROLENE 5 0 C 1 36 (SUTURE) IMPLANT
SUT PROLENE 6 0 C 1 30 (SUTURE) IMPLANT
SUT SILK  1 MH (SUTURE) ×2
SUT SILK 1 MH (SUTURE) ×2 IMPLANT
SUT VIC AB 2-0 CT1 27 (SUTURE)
SUT VIC AB 2-0 CT1 TAPERPNT 27 (SUTURE) IMPLANT
SUT VIC AB 2-0 CTX 36 (SUTURE) IMPLANT
SUT VIC AB 3-0 SH 8-18 (SUTURE) IMPLANT
SYR 50ML LL SCALE MARK (SYRINGE) ×4 IMPLANT
SYR BULB IRRIG 60ML STRL (SYRINGE) IMPLANT
SYR MEDRAD MARK V 150ML (SYRINGE) ×4 IMPLANT
TOWEL GREEN STERILE (TOWEL DISPOSABLE) ×8 IMPLANT
TRANSDUCER W/STOPCOCK (MISCELLANEOUS) ×8 IMPLANT
TRAY FOLEY SLVR 16FR TEMP STAT (SET/KITS/TRAYS/PACK) IMPLANT
VALVE 23 ULTRA SAPIEN KIT (Valve) ×4 IMPLANT
WIRE EMERALD 3MM-J .035X150CM (WIRE) ×4 IMPLANT
WIRE EMERALD 3MM-J .035X260CM (WIRE) ×4 IMPLANT

## 2020-08-21 NOTE — Transfer of Care (Signed)
Immediate Anesthesia Transfer of Care Note  Patient: Stephanie Rosario  Procedure(s) Performed: TRANSCATHETER AORTIC VALVE REPLACEMENT, TRANSFEMORAL (Chest) INTRAOPERATIVE TRANSTHORACIC ECHOCARDIOGRAM (Chest)  Patient Location: PACU  Anesthesia Type:MAC  Level of Consciousness: awake, alert  and oriented  Airway & Oxygen Therapy: Patient Spontanous Breathing  Post-op Assessment: Report given to RN and Post -op Vital signs reviewed and stable  Post vital signs: Reviewed and stable  Last Vitals:  Vitals Value Taken Time  BP 94/28 08/21/20 1338  Temp 36.3 C 08/21/20 1318  Pulse 57 08/21/20 1341  Resp 20 08/21/20 1341  SpO2 96 % 08/21/20 1341  Vitals shown include unvalidated device data.  Last Pain:  Vitals:   08/21/20 1318  TempSrc: Temporal  PainSc: 0-No pain         Complications: No notable events documented.

## 2020-08-21 NOTE — Progress Notes (Addendum)
Pt assisted out of bed to chair.  Saltines and ginger ale given and well received.  VSS stable.  Pedal pulses remain weak but palpable.  Left groin site noted with mild oozing.  Will cont plan of care.

## 2020-08-21 NOTE — H&P (Signed)
[facility] Admission Note   History of Present Illness: 80 yo female with history of coarctation repair at age 43, HTN, HLD, OSA and severe AS here today for TAVR. She is very active. Recent worsened dyspnea and fatigue. Echo with critical AS. Cardiac cath by me early June 2022 with non-obstructive CAD. Plans for TAVR today from transfemoral approach.  Patient is married and [place] locally [place] country between [place] and [place].  Up until last 2 to 3 years patient has remained quite active physically.  She enjoys hiking and kayaking with her husband.  Primary Care Physician: Ronita Hipps, MD Primary Cardiologist: Bettina Gavia  Past Medical History:  Diagnosis Date   Aortic stenosis    Benign essential hypertension    Benign paroxysmal positional vertigo    History of nephrolithiasis    Nuclear sclerotic cataract of both eyes 12/30/2013   Primary open angle glaucoma of both eyes, severe stage 12/30/2013   Pure hypercholesterolemia    Sleep apnea    CPAP    Past Surgical History:  Procedure Laterality Date   BREAST BIOPSY     Benign   COARCTATION OF AORTA REPAIR     Age 31   RIGHT/LEFT HEART CATH AND CORONARY ANGIOGRAPHY N/A 08/05/2020   Procedure: RIGHT/LEFT HEART CATH AND CORONARY ANGIOGRAPHY;  Surgeon: Burnell Blanks, MD;  Location: [facility];  Service: Cardiovascular;  Laterality: N/A;    Current Facility-Administered Medications  Medication Dose Route Frequency Provider Last Rate Last Admin   [START ON 08/22/2020] 0.9 %  sodium chloride infusion   Intravenous Continuous Eileen Stanford, PA-C       ceFAZolin (ANCEF) IVPB 2g/100 mL premix  2 g Intravenous To OR Burnell Blanks, MD       chlorhexidine (HIBICLENS) 4 % liquid 2 application  30 mL Topical UD Eileen Stanford, PA-C       chlorhexidine (HIBICLENS) 4 % liquid 4 application  60 mL Topical Once Eileen Stanford, PA-C       And   [START ON 08/22/2020] chlorhexidine  (HIBICLENS) 4 % liquid 4 application  60 mL Topical Once Angelena Form R, PA-C       dexmedetomidine (PRECEDEX) 400 MCG/100ML (4 mcg/mL) infusion  0.1-0.7 mcg/kg/hr Intravenous To OR Burnell Blanks, MD       heparin 30,000 units/NS 1000 mL solution for CELLSAVER   Other To OR Burnell Blanks, MD       magnesium sulfate (IV Push/IM) injection 40 mEq  40 mEq Other To OR Burnell Blanks, MD       norepinephrine (LEVOPHED) 69m in 2590mpremix infusion  0-10 mcg/min Intravenous To OR McBurnell BlanksMD       potassium chloride injection 80 mEq  80 mEq Other To OR McBurnell BlanksMD       vancomycin (VANCOREADY) IVPB 1250 mg/250 mL  1,250 mg Intravenous Once McBurnell BlanksMD        Allergies  Allergen Reactions   Sulfa Antibiotics Rash    Social History   Socioeconomic History   Marital status: Unknown    Spouse name: Not on file   Number of children: Not on file   Years of education: Not on file   Highest education level: Not on file  Occupational History   Not on file  Tobacco Use   Smoking status: Never   Smokeless tobacco: Never  Vaping Use   Vaping Use: Never used  Substance and Sexual Activity   Alcohol use: Yes    Alcohol/week: 1.0 standard drink    Types: 1 Glasses of wine per week    Comment: Rare   Drug use: Never   Sexual activity: Not on file  Other Topics Concern   Not on file  Social History Narrative   Not on file   Social Determinants of Health   Financial Resource Strain: Not on file  Food Insecurity: Not on file  Transportation Needs: Not on file  Physical Activity: Not on file  Stress: Not on file  Social Connections: Not on file  Intimate Partner Violence: Not on file    Family History  Problem Relation Age of Onset   Heart attack Mother    Congestive Heart Failure Father    Hypertension Sister     Review of Systems:  As stated in the HPI and otherwise negative.   BP (!) 149/46    Pulse 69   Temp 98.1 F (36.7 C) (Oral)   Resp 18   Ht _0  (1.575 m)   Wt 68 kg   SpO2 95%   BMI 27.44 kg/m   Physical Examination: General: Well developed, well nourished, NAD  HEENT: OP clear, mucus membranes moist  SKIN: warm, dry. No rashes. Neuro: No focal deficits  Musculoskeletal: Muscle strength 5/5 all ext  Psychiatric: Mood and affect normal  Neck: No JVD, no carotid bruits, no thyromegaly, no lymphadenopathy.  Lungs:Clear bilaterally, no wheezes, rhonci, crackles Cardiovascular: Regular rate and rhythm. Loud, harsh, late peaking systolic murmur.  Abdomen:Soft. Bowel sounds present. Non-tender.  Extremities:No lower extremity edema. Pulses are 2 + in the bilateral DP/PT.      ECHOCARDIOGRAM REPORT        Patient Name:   Stephanie Rosario Date of Exam: 07/30/2020  Medical Rec #:  [id number]     Height:       62.0 in  Accession #:    [id number]    Weight:       149.2 lb  Date of Birth:  24-Apr-1940     BSA:          1.688 m  Patient Age:    47 years      BP:           138/75 mmHg  Patient Gender: F             HR:           93 bpm.  Exam Location:  [facility]   Procedure: 2D Echo and Strain Analysis   Indications:    Precordial pain [R07.2 (ICD-10-CM)]; Benign essential                  hypertension [I10 (ICD-10-CM)]     History:        Patient has no prior history of Echocardiogram  examinations.                  Signs/Symptoms:Dyspnea; Risk Factors:Dyslipidemia.     Sonographer:    Luane School  Referring Phys: [id number] [place] to: Lauree Chandler MD   IMPRESSIONS     1. Left ventricular ejection fraction, by estimation, is 65 to 70%. The  left ventricle has hyperdynamic function. The left ventricle has no  regional wall motion abnormalities. There is severe concentric left  ventricular hypertrophy. Left ventricular  diastolic parameters are consistent with Grade I diastolic dysfunction  (impaired relaxation). The average left ventricular  global longitudinal  strain is -6.5 %. The global longitudinal strain is abnormal.   2. Right ventricular systolic function is normal. The right ventricular  size is normal. There is normal pulmonary artery systolic pressure.   3. The mitral valve is normal in structure. No evidence of mitral valve  regurgitation. No evidence of mitral stenosis.   4. The aortic valve has an indeterminant number of cusps. There is severe  calcifcation of the aortic valve. Aortic valve regurgitation is not  visualized. Severe/critical aortic valve stenosis.   5. There is moderate dilatation of the ascending aorta, measuring 42 mm.   6. The inferior vena cava is normal in size with greater than 50%  respiratory variability, suggesting right atrial pressure of 3 mmHg.   FINDINGS   Left Ventricle: Left ventricular ejection fraction, by estimation, is 65  to 70%. The left ventricle has hyperdynamic function. The left ventricle  has no regional wall motion abnormalities. The average left ventricular  global longitudinal strain is -6.5  %. The global longitudinal strain is abnormal. The left ventricular  internal cavity size was normal in size. There is severe concentric left  ventricular hypertrophy. Left ventricular diastolic parameters are  consistent with Grade I diastolic dysfunction  (impaired relaxation). Indeterminate filling pressures.   Right Ventricle: The right ventricular size is normal. No increase in  right ventricular wall thickness. Right ventricular systolic function is  normal. There is normal pulmonary artery systolic pressure. The tricuspid  regurgitant velocity is 1.29 m/s, and   with an assumed right atrial pressure of 3 mmHg, the estimated right  ventricular systolic pressure is 9.7 mmHg.   Left Atrium: Left atrial size was normal in size.   Right Atrium: Right atrial size was normal in size.   Pericardium: There is no evidence of pericardial effusion.   Mitral Valve: The mitral  valve is normal in structure. No evidence of  mitral valve regurgitation. No evidence of mitral valve stenosis.   Tricuspid Valve: The tricuspid valve is normal in structure. Tricuspid  valve regurgitation is trivial. No evidence of tricuspid stenosis.   Aortic Valve: The aortic valve has an indeterminant number of cusps. There  is severe calcifcation of the aortic valve. Aortic valve regurgitation is  not visualized. Severe/critical aortic stenosis is present. Aortic valve  mean gradient measures 83.0  mmHg. Aortic valve peak gradient measures 122.3 mmHg. Aortic valve area,  by VTI measures 0.41 cm.   Pulmonic Valve: The pulmonic valve was normal in structure. Pulmonic valve  regurgitation is not visualized. No evidence of pulmonic stenosis.   Aorta: The aortic root is normal in size and structure and the aortic arch  was not well visualized. There is moderate dilatation of the ascending  aorta, measuring 42 mm.   Venous: A normal flow pattern is recorded from the right upper pulmonary  vein. The inferior vena cava is normal in size with greater than 50%  respiratory variability, suggesting right atrial pressure of 3 mmHg.   IAS/Shunts: No atrial level shunt detected by color flow Doppler.      LEFT VENTRICLE  PLAX 2D  LVIDd:         3.30 cm     Diastology  LVIDs:         2.10 cm     LV e' medial:    3.26 cm/s  LV PW:         1.60 cm     LV E/e' medial:  30.2  LV IVS:  1.60 cm     LV e' lateral:   4.03 cm/s  LVOT diam:     2.00 cm     LV E/e' lateral: 24.4  LV SV:         62  LV SV Index:   37          2D Longitudinal Strain  LVOT Area:     3.14 cm    2D Strain GLS Avg:     -6.5 %     LV Volumes (MOD)  LV vol d, MOD A2C: 49.0 ml  LV vol d, MOD A4C: 54.7 ml  LV vol s, MOD A2C: 23.6 ml  LV vol s, MOD A4C: 24.5 ml  LV SV MOD A2C:     25.4 ml  LV SV MOD A4C:     54.7 ml  LV SV MOD BP:      28.2 ml   RIGHT VENTRICLE             IVC  RV S prime:     10.70 cm/s  IVC  diam: 1.30 cm  TAPSE (M-mode): 2.0 cm   LEFT ATRIUM             Index       RIGHT ATRIUM           Index  LA diam:        3.30 cm 1.96 cm/m  RA Area:     12.40 cm  LA Vol (A2C):   46.7 ml 27.67 ml/m RA Volume:   28.60 ml  16.94 ml/m  LA Vol (A4C):   35.1 ml 20.80 ml/m  LA Biplane Vol: 42.3 ml 25.06 ml/m   AORTIC VALVE  AV Area (Vmax):    0.48 cm  AV Area (Vmean):   0.45 cm  AV Area (VTI):     0.41 cm  AV Vmax:           553.00 cm/s  AV Vmean:          433.000 cm/s  AV VTI:            1.490 m  AV Peak Grad:      122.3 mmHg  AV Mean Grad:      83.0 mmHg  LVOT Vmax:         83.65 cm/s  LVOT Vmean:        62.250 cm/s  LVOT VTI:          0.196 m  LVOT/AV VTI ratio: 0.13     AORTA  Ao Root diam: 3.20 cm  Ao Asc diam:  4.20 cm  Ao Desc diam: 2.00 cm   MITRAL VALVE                TRICUSPID VALVE  MV Area (PHT): 9.14 cm     TR Peak grad:   6.7 mmHg  MV Decel Time: 83 msec      TR Vmax:        129.00 cm/s  MV E velocity: 98.30 cm/s  MV A velocity: 146.00 cm/s  SHUNTS  MV E/A ratio:  0.67         Systemic VTI:  0.20 m                              Systemic Diam: 2.00 cm   Shirlee More MD  Electronically signed by Shirlee More MD  Signature Date/Time: 07/30/2020/4:57:58 PM  RIGHT/LEFT HEART CATH AND CORONARY ANGIOGRAPHY     Conclusion     ?          Prox RCA lesion is 30% stenosed. ?          Mid RCA lesion is 50% stenosed. ?          Mid Cx lesion is 30% stenosed. ?          Prox LAD lesion is 30% stenosed. ?          Mid LAD lesion is 50% stenosed.   1. Non-obstructive CAD 2. Severe aortic stenosis (mean gradient 64 mmHg, peak to peak gradient 110 mmHg, AVA 0.46 cm2).   Will continue workup for TAVR. We will arrange CT scans and then proceed with official surgical consultation.         Indications   Severe aortic stenosis [I35.0 (ICD-10-CM)]     Procedural Details   Technical Details        Indication: Severe aortic stenosis   Procedure:  The risks, benefits, complications, treatment options, and expected outcomes were discussed with the patient. The patient and/or family concurred with the proposed plan, giving informed consent. The patient was brought to the cath lab after IV hydration was given. The patient was sedated with Versed and Fentanyl. The right groin was prepped and draped. U/S guided access into the right femoral vein with placement of a 7 French sheath in the right femoral vein. Right heart catheterization performed with a balloon tipped catheter. The right wrist was prepped and draped in a sterile fashion. 1% lidocaine was used for local anesthesia. Using the modified Seldinger access technique, a 5 French sheath was placed in the right radial artery. 3 mg Verapamil was given through the sheath. 3500 units IV heparin was given. Standard diagnostic catheters were used to perform selective coronary angiography. I crossed the aortic valve with an AL-1 catheter and a straight wire. LV pressures measured. No LV gram. The sheath was removed from the right radial artery and a Terumo hemostasis band was applied at the arteriotomy site on the right wrist.     Estimated blood loss <50 mL.   During this procedure medications were administered to achieve and maintain moderate conscious sedation while the patient's heart rate, blood pressure, and oxygen saturation were continuously monitored and I was present face-to-face 100% of this time.     Medications (Filter: Administrations occurring from 0830 to 0930 on 08/05/20)  important  Continuous medications are totaled by the amount administered until 08/05/20 0930.     fentaNYL (SUBLIMAZE) injection (mcg) Total dose:  50 mcg   Date/Time       Rate/Dose/Volume      Action   08/05/20 0837 25 mcg            Given    0859    25 mcg            Given      midazolam (VERSED) injection (mg) Total dose:  2 mg   Date/Time       Rate/Dose/Volume      Action   08/05/20 0838 1 mg     Given    0858    1 mg    Given      lidocaine (PF) (XYLOCAINE) 1 % injection (mL) Total volume:  24 mL   Date/Time       Rate/Dose/Volume      Action   08/05/20 0848 2 mL  Given    0851    2 mL    Given    0858    20 mL  Given      Radial Cocktail/Verapamil only (mL) Total volume:  10 mL   Date/Time       Rate/Dose/Volume      Action   08/05/20 0853 10 mL  Given      heparin sodium (porcine) injection (Units) Total dose:  3,500 Units   Date/Time       Rate/Dose/Volume      Action   08/05/20 0907 3,500 Units      Given      iohexol (OMNIPAQUE) 350 MG/ML injection (mL) Total volume:  40 mL   Date/Time       Rate/Dose/Volume      Action   08/05/20 0924 40 mL  Given      Heparin (Porcine) in NaCl 1000-0.9 UT/500ML-% SOLN (mL) Total volume:  1,000 mL   Date/Time       Rate/Dose/Volume      Action   08/05/20 0920 500 mL            Given    0921    500 mL            Given        Sedation Time   Sedation Time Physician-1: 40 minutes 48 seconds     Contrast   Medication Name        Total Dose iohexol (OMNIPAQUE) 350 MG/ML injection            40 mL     Radiation/Fluoro   Fluoro time: 5.6 (min) DAP: 75643 (mGycm2) Cumulative Air Kerma: 329 (mGy)   Complications     Complications documented before study signed (08/05/2020  9:35 AM)       RIGHT/LEFT HEART CATH AND CORONARY ANGIOGRAPHY     None    Documented by Burnell Blanks, MD 08/05/2020  9:27 AM Date Found: 08/05/2020 Time Range: Intraprocedure         Coronary Findings     Diagnostic Dominance: Right   Left Anterior Descending Vessel is large. Prox LAD lesion is 30% stenosed. Mid LAD lesion is 50% stenosed. Left Circumflex Vessel is large. Mid Cx lesion is 30% stenosed. Right Coronary Artery Vessel is large. Prox RCA lesion is 30% stenosed. Mid RCA lesion is 50% stenosed.   Intervention     No interventions have been documented.                   Coronary Diagrams      Diagnostic Dominance: Right       Intervention         Implants      No implant documentation for this case.     Syngo Images    Show images for CARDIAC CATHETERIZATION   Images on Long Term Storage    Show images for Corrow, Deborah Lazcano to Procedure Log     Procedure Log       Hemo Data   Flowsheet Row           Most Recent Value Fick Cardiac Output    4.35 L/min Fick Cardiac Output Index      2.57 (L/min)/BSA Aortic Mean Gradient  64.17 mmHg Aortic Peak Gradient   110 mmHg Aortic Valve Area        0.46 Aortic Value Area Index  0.27 cm2/BSA RA A Wave     6 mmHg RA V Wave     3 mmHg RA Mean         3 mmHg RV Systolic Pressure  31 mmHg RV Diastolic Pressure 2 mmHg RV EDP           5 mmHg PA Systolic Pressure  28 mmHg PA Diastolic Pressure 8 mmHg PA Mean         17 mmHg PW A Wave     11 mmHg PW V Wave     10 mmHg PW Mean        8 mmHg AO Systolic Pressure  93 mmHg AO Diastolic Pressure 42 mmHg AO Mean         64 mmHg LV Systolic Pressure   476 mmHg LV Diastolic Pressure 8 mmHg LV EDP           22 mmHg AOp Systolic Pressure            88 mmHg AOp Diastolic Pressure          40 mmHg AOp Mean Pressure   61 mmHg LVp Systolic Pressure 546 mmHg LVp Diastolic Pressure           12 mmHg LVp EDP Pressure      24 mmHg QP/QS 1 TPVR Index     6.61 HRUI TSVR Index     24.88 HRUI PVR SVR Ratio           0.15 TPVR/TSVR Ratio      0.27       Cardiac TAVR CT   TECHNIQUE: The patient was scanned on a Siemens Force 503 slice scanner. A 120 kV retrospective scan was triggered in the descending thoracic aorta at 111 HU's. Gantry rotation speed was 270 msecs and collimation was .9 mm. No beta blockade or nitro were given. The 3D data set was reconstructed in 5% intervals of the R-R cycle. Systolic and diastolic phases were analyzed on a dedicated work station using MPR, MIP and VRT modes. The patient received 95 cc of contrast.    FINDINGS: Aortic Valve: Severely thickened aortic valve with heavy calcification and reduced excursion the planimeter valve area is 0.897 Sq cm consistent with severe aortic stenosis   Number of leaflets: There is calcific fusion: valve appears functionally bicuspid   LVOT calcification: None   Annular calcification: Minimal   Aortic Valve Calcium Score: 4031   Prosthetic Valve: NA   Mitral Valve: No MAC   LV: Left ventricular function is normal with estimated LVEF 60-65%.   Aortic Annulus Measurements   Major annulus diameter: 27 mm   Minor annulus diameter: 21 mm   Annular perimeter: 4.34  cm2   Annular area: 76 mm   Aortic Root Measurements-   Sinus of Valsalva perimeter: 102 mm   Sinotubular Junction: 30 mm   Ascending Thoracic Aorta: 40 mm   Aortic Arch: 24 mm   Descending Thoracic Aorta: 14 mm X 14 mm at narrowest that reconstitutes to 19 mm below narrowing (< 50% change)   Descending aortic atherosclerosis noted.   No evidence of aortic collateral flow or post surgical changes   Sinus of Valsalva Measurements:   Right coronary cusp width: 29 mm   Left coronary cusp width: 30 mm   Non coronary cusp width: 32 mm   Coronary Artery Height above Annulus:   Left Main: 11.6 mm   Right Coronary: 9.6  mm   Coronary Calcium Score:  Left main: 10   Left anterior descending artery: 132   Left circumflex artery: 36   Right coronary artery: 106   Total: 284   Percentile: 69th for age, sex, and race matched control.   Optimum Fluoroscopic Angle for Delivery: LAO 1, CAU 4 (Anterior View)   Valves for structural team consideration: 29 mm CoreValve; 26 mm Edwards Sapien   IMPRESSION: 1. Severe Aortic stenosis. Findings pertinent to TAVR procedure are detailed above.   2. Descending aortic narrow as above; no post surgical changes noted.   3. Coronary calcium score of 287. This was 69th percentile for age, sex, and race matched  control.   4.  Mild aortic aneurysm: 40 mm.   RECOMMENDATIONS:   Coronary artery calcium (CAC) score is a strong predictor of incident coronary heart disease (CHD) and provides predictive information beyond traditional risk factors. CAC scoring is reasonable to use in the decision to withhold, postpone, or initiate statin therapy in intermediate-risk or selected borderline-risk asymptomatic adults (age 83-75 years and LDL-C >=70 to <190 mg/dL) who do not have diabetes or established atherosclerotic cardiovascular disease (ASCVD).* In intermediate-risk (10-year ASCVD risk >=7.5% to <20%) adults or selected borderline-risk (10-year ASCVD risk >=5% to <7.5%) adults in whom a CAC score is measured for the purpose of making a treatment decision the following recommendations have been made:   If CAC = 0, it is reasonable to withhold statin therapy and reassess in 5 to 10 years, as long as higher risk conditions are absent (diabetes mellitus, family history of premature CHD in first degree relatives (males <55 years; females <65 years), cigarette smoking, LDL >=190 mg/dL or other independent risk factors).   If CAC is 1 to 99, it is reasonable to initiate statin therapy for patients >=86 years of age.   If CAC is >=100 or >=75th percentile, it is reasonable to initiate statin therapy at any age.   Cardiology referral should be considered for patients with CAC scores >=400 or >=75th percentile.   *2018 AHA/ACC/AACVPR/AAPA/ABC/ACPM/ADA/AGS/APhA/ASPC/NLA/PCNA Guideline on the Management of Blood Cholesterol: A Report of the American College of Cardiology/American Heart Association Task Force on Clinical Practice Guidelines. J Am Coll Cardiol. 2019;73(24):3168-3209.   Mahesh  Chandrasekhar     Electronically Signed   By: Rudean Haskell MD   On: 08/14/2020 15:20       CT ANGIOGRAPHY CHEST, ABDOMEN AND PELVIS   TECHNIQUE: Multidetector CT imaging through the chest,  abdomen and pelvis was performed using the standard protocol during bolus administration of intravenous contrast. Multiplanar reconstructed images and MIPs were obtained and reviewed to evaluate the vascular anatomy.   CONTRAST:  8m OMNIPAQUE IOHEXOL 350 MG/ML SOLN   COMPARISON:  No priors.   FINDINGS: CTA CHEST FINDINGS   Cardiovascular: Heart size is normal. There is no significant pericardial fluid, thickening or pericardial calcification. There is aortic atherosclerosis, as well as atherosclerosis of the great vessels of the mediastinum and the coronary arteries, including calcified atherosclerotic plaque in the left anterior descending coronary artery. Severe thickening calcification of the aortic valve. Ectasia of ascending thoracic aorta (4.0 cm in diameter). In the anterior mediastinum (axial image 60 of series 6) there is a well-defined 2.7 x 1.5 cm low-attenuation lesion anterior to the ascending thoracic aorta, favored to represent a small pericardial cyst.   Mediastinum/Lymph Nodes: No pathologically enlarged mediastinal or hilar lymph nodes. Esophagus is unremarkable in appearance. No axillary lymphadenopathy.   Lungs/Pleura: No suspicious appearing pulmonary nodules or masses are noted.  No acute consolidative airspace disease. No pleural effusions.   Musculoskeletal/Soft Tissues: Hypoplastic left fourth rib. There are no aggressive appearing lytic or blastic lesions noted in the visualized portions of the skeleton.   CTA ABDOMEN AND PELVIS FINDINGS   Hepatobiliary: Diffuse low attenuation throughout the hepatic parenchyma, indicative of hepatic steatosis. The contour of the liver is slightly nodular, suggesting early changes of cirrhosis. No suspicious cystic or solid hepatic lesions. No intra or extrahepatic biliary ductal dilatation. Gallbladder is normal in appearance.   Pancreas: No pancreatic mass. No pancreatic ductal dilatation. No pancreatic or  peripancreatic fluid collections or inflammatory changes.   Spleen: Unremarkable.   Adrenals/Urinary Tract: Nonobstructive calculi in the collecting systems of both kidneys measuring up to 1.5 x 0.7 cm in the right renal pelvis. Numerous subcentimeter low-attenuation lesions in both kidneys, too small to characterize, but statistically likely to represent tiny cysts. Mild multifocal cortical thinning and parenchymal atrophy in the kidneys bilaterally (right greater than left), presumably chronic post infectious or inflammatory scarring. Bilateral adrenal glands are normal in appearance. No hydroureteronephrosis. Urinary bladder is normal in appearance.   Stomach/Bowel: The appearance of the stomach is normal. No pathologic dilatation of small bowel or colon. Numerous colonic diverticulae are noted, without surrounding inflammatory changes to suggest an acute diverticulitis at this time. Normal appendix.   Vascular/Lymphatic: Aortic atherosclerosis, without evidence of aneurysm or dissection in the abdominal or pelvic vasculature. Vascular findings and measurements pertinent to potential TAVR procedure, as detailed below. No lymphadenopathy noted in the abdomen or pelvis.   Reproductive: Bilateral tubal ligation clips are incidentally noted. Uterus and ovaries are otherwise unremarkable in appearance.   Other: No significant volume of ascites.  No pneumoperitoneum.   Musculoskeletal: There are no aggressive appearing lytic or blastic lesions noted in the visualized portions of the skeleton.   VASCULAR MEASUREMENTS PERTINENT TO TAVR:   AORTA:   Minimal Aortic Diameter-12 x 10 mm   Severity of Aortic Calcification-mild   RIGHT PELVIS:   Right Common Iliac Artery -   Minimal Diameter-8.6 x 9.5 mm   Tortuosity-mild   Calcification-mild   Right External Iliac Artery -   Minimal Diameter-7.2 x 6.9 mm   Tortuosity-moderate to severe   Calcification-none   Right  Common Femoral Artery -   Minimal Diameter-7.3 x 6.6 mm   Tortuosity-mild   Calcification-none   LEFT PELVIS:   Left Common Iliac Artery -   Minimal Diameter-8.3 x 8.3 mm   Tortuosity-mild   Calcification-mild   Left External Iliac Artery -   Minimal Diameter-7.9 x 7.3 mm   Tortuosity-moderate to severe   Calcification-none   Left Common Femoral Artery -   Minimal Diameter-6.5 x 6.7 mm   Tortuosity-mild   Calcification-minimal   Review of the MIP images confirms the above findings.   IMPRESSION: 1. Vascular findings and measurements pertinent to potential TAVR procedure, as detailed above. 2. Severe thickening calcification of the aortic valve. 3. Aortic atherosclerosis, in addition to left anterior descending coronary artery disease. There is also ectasia of ascending thoracic aorta (4.0 cm in diameter). Recommend annual imaging followup by CTA or MRA. This recommendation follows 2010 ACCF/AHA/AATS/ACR/ASA/SCA/SCAI/SIR/STS/SVM Guidelines for the Diagnosis and Management of Patients with Thoracic Aortic Disease. Circulation. 2010; 121: J628-B151. Aortic aneurysm NOS (ICD10-I71.9). 4. Hepatic steatosis with evidence of developing cirrhosis. 5. Nonobstructive calculi in the collecting systems of both kidneys measuring up to 1.5 x 0.7 cm in the right renal pelvis. 6. Colonic diverticulosis without evidence of acute diverticulitis  at this time. 7. Additional incidental findings, as above.     Electronically Signed   By: Vinnie Langton M.D.   On: 08/12/2020 15:09   _______________________________   Procedure: Isolated AVR  Risk of Mortality:  2.826%  Renal Failure:  1.717%  Permanent Stroke:  3.109%  Prolonged Ventilation:  7.083%  DSW Infection:  0.080%  Reoperation:  3.271%  Morbidity or Mortality:  11.134%  Short Length of Stay:  33.340%  Long Length of Stay:  5.262%    Recent Labs: 08/21/2020: ALT 15; BUN 35; Creatinine, Ser 1.34;  Hemoglobin 11.8; Platelets 165; Potassium 4.0; Sodium 140    Wt Readings from Last 3 Encounters:  08/21/20 68 kg  08/17/20 68.2 kg  08/14/20 67.6 kg      Assessment and Plan:   1. Severe Aortic Valve Stenosis: She has severe, stage D aortic valve stenosis. I have personally reviewed the echo images. The aortic valve is thickened, calcified with limited leaflet mobility. Will plan TAVR today from the transfemoral approach.   I have reviewed the natural history of aortic stenosis with the patient and their family members  who are present today. We have discussed the limitations of medical therapy and the poor prognosis associated with symptomatic aortic stenosis. We have reviewed potential treatment options, including palliative medical therapy, conventional surgical aortic valve replacement, and transcatheter aortic valve replacement. We discussed treatment options in the context of the patient's specific comorbid medical conditions.   Orders Placed This Encounter  Procedures   SARS Coronavirus 2 by RT PCR (hospital order, performed in [facility] hospital lab) Nasopharyngeal Nasopharyngeal Swab   DG Chest 2 View   CBC   Comprehensive metabolic panel   Protime-INR   Hemoglobin A1c   Blood gas, arterial   Diet NPO time specified   Initiate Pre-op Protocol   Pre-admission testing diagnosis   Record height and weight   Instruct patient to arrive at [facility] for 1st case, 0630 for Wednesday 1st case, 0800 for all other cases   Home medications to HOLD: Please continue taking all of your current medications through the day before surgery. On the day of surgery take no medications except eye drops.   Home medications to take: Please continue taking all of your current medications through the day before surgery. On the day of surgery take no medications except eye drops.   Patient Scrub   Informed Consent Details: Physician/Practitioner Attestation; Transcribe to consent form  and obtain patient signature   Verify informed consent   Verify informed consent for Blood and/or Blood Products Transfusion has been signed.   Add surgeon to patient's treatment team.   Prep/Clip   Clip both groins   Confirm: EKG completed within 1 month prior to surgery.   Confirm: Chest X-ray completed within 2 weeks of surgery   Call MD For Abnormal Lab   Shower Chin To Toes With 60 mL chlorhexidine (HIBICLENS) the night before surgery   Shower Chin To Toes With 60 mL chlorhexidine (HIBICLENS) in AM of surgery after pre-op clip completed   Informed Consent Details: Physician/Practitioner Attestation; Transcribe to consent form and obtain patient signature   Lab instructions: Confirm labs drawn within 7 days of surgery   Informed Consent Details: Physician/Practitioner Attestation; Transcribe to consent form and obtain patient signature   Provider attestation of informed consent for procedure/surgical case   TAVR surgery pharmacy consult   ECHO TEE   Type and screen [place]  ABO/Rh      Signed, Lauree Chandler, MD 08/21/2020 10:25 AM    [facility] [place], [place], [place]  [address] Phone: [phone]; Fax: [phone]

## 2020-08-21 NOTE — Op Note (Signed)
HEART AND VASCULAR CENTER   MULTIDISCIPLINARY HEART VALVE TEAM   TAVR OPERATIVE NOTE   Date of Procedure:  08/21/2020  Preoperative Diagnosis: Severe Aortic Stenosis   Postoperative Diagnosis: Same   Procedure:   Transcatheter Aortic Valve Replacement - Percutaneous Right Transfemoral Approach  Edwards Sapien 3 Ultra THV (size 23 mm, model # [id number], serial # [id number])   Co-Surgeons:  Alleen Borne, MD and Verne Carrow, MD   Anesthesiologist:  G. Stoltzfus  Echocardiographer:  Elspeth Cho  Pre-operative Echo Findings: Severe aortic stenosis Normal left ventricular systolic function  Post-operative Echo Findings: No paravalvular leak Normal left ventricular systolic function   BRIEF CLINICAL NOTE AND INDICATIONS FOR SURGERY  Patient has stage D1 severe symptomatic aortic stenosis.  She describes classical symptoms of progressive exertional shortness of breath and chest discomfort over the past 2 to 3 years with recent symptoms consistent with chronic diastolic congestive heart failure and angina pectoris, New York Heart Association functional class III.  In addition, the patient has had several episodes of severe dizziness and near syncope all in association with physical exertion.  I have personally reviewed the patient's recent transthoracic echocardiogram, diagnostic cardiac catheterization, EKG, and CT angiograms.  Echocardiogram demonstrates the presence of Sievers type I bicuspid aortic valve with critical aortic stenosis.  There is a single raphae with moderate calcification but severely restricted leaflet mobility.  Peak velocity across aortic valve measured greater than 5.5 m/s corresponding to mean transvalvular gradient estimated 83 mmHg and aortic valve area calculated only 0.41 cm by VTI.  Left ventricular systolic function remains hyperdynamic but there is moderate to severe left ventricular hypertrophy.  Diagnostic cardiac catheterization confirmed the  presence of severe aortic stenosis with peak to peak and mean transvalvular gradients measured 110 and 64 mmHg, respectively, corresponding to aortic valve area calculated only 0.46 cm.  There was nonobstructive coronary artery disease.  EKG reveals sinus rhythm with no significant AV conduction delay.  Cardiac-gated CTA of the heart reveals anatomical characteristics consistent with aortic stenosis suitable for treatment by transcatheter aortic valve replacement without any significant complicating feature and CTA of the aorta and iliac vessels demonstrate what appears to be adequate pelvic vascular access to facilitate a transfemoral approach other than mild narrowing at the site of previous repair of coarctation.   During the course of the patient's preoperative work up they have been evaluated comprehensively by a multidisciplinary team of specialists coordinated through the [facility] in the [facility].  They have been demonstrated to suffer from symptomatic severe aortic stenosis as noted above. The patient has been counseled extensively as to the relative risks and benefits of all options for the treatment of severe aortic stenosis including long term medical therapy, conventional surgery for aortic valve replacement, and transcatheter aortic valve replacement.  All questions have been answered, and the patient provides full informed consent for the operation as described.   DETAILS OF THE OPERATIVE PROCEDURE  PREPARATION:    The patient was brought to the operating room on the above mentioned date and appropriate monitoring was established by the anesthesia team. The patient was placed in the supine position on the operating table.  Intravenous antibiotics were administered. The patient was monitored closely throughout the procedure under conscious sedation.    Baseline transthoracic echocardiogram was performed. The patient's abdomen and both  groins were prepped and draped in a sterile manner. A time out procedure was performed.   PERIPHERAL ACCESS:  Using the modified Seldinger technique, femoral arterial and venous access was obtained with placement of 6 Fr sheaths on the left side.  A pigtail diagnostic catheter was passed through the left arterial sheath under fluoroscopic guidance into the aortic root.  A temporary transvenous pacemaker catheter was passed through the left femoral venous sheath under fluoroscopic guidance into the right ventricle.  The pacemaker was tested to ensure stable lead placement and pacemaker capture. Aortic root angiography was performed in order to determine the optimal angiographic angle for valve deployment.   TRANSFEMORAL ACCESS:   Percutaneous transfemoral access and sheath placement was performed using ultrasound guidance.  The right common femoral artery was cannulated using a micropuncture needle and appropriate location was verified using hand injection angiogram.  A pair of Abbott Perclose percutaneous closure devices were placed and a 6 French sheath replaced into the femoral artery.  The patient was heparinized systemically and ACT verified > 250 seconds.    A 14 Fr transfemoral E-sheath was introduced into the right common femoral artery after progressively dilating over an Amplatz superstiff wire. An AL-1 catheter was used to direct a straight-tip exchange length wire across the native aortic valve into the left ventricle. This was exchanged out for a pigtail catheter and position was confirmed in the LV apex. Simultaneous LV and Ao pressures were recorded.  The pigtail catheter was exchanged for an Amplatz Extra-stiff wire in the LV apex.     BALLOON AORTIC VALVULOPLASTY:   Balloon aortic valvuloplasty was performed using an 18 mm True valvuloplasty balloon.  Once optimal position was achieved, BAV was done under rapid ventricular pacing. The patient recovered well hemodynamically.     TRANSCATHETER HEART VALVE DEPLOYMENT:   An Edwards Sapien 3 Ultra transcatheter heart valve (size 23 mm) was prepared and crimped per manufacturer's guidelines, and the proper orientation of the valve is confirmed on the [employer] delivery system. The valve was advanced through the introducer sheath using normal technique until in an appropriate position in the abdominal aorta beyond the sheath tip. The balloon was then retracted and using the fine-tuning wheel was centered on the valve. The valve was then advanced across the aortic arch using appropriate flexion of the catheter. The valve was carefully positioned across the aortic valve annulus. The Commander catheter was retracted using normal technique. Once final position of the valve has been confirmed by angiographic assessment, the valve is deployed while temporarily holding ventilation and during rapid ventricular pacing to maintain systolic blood pressure < 50 mmHg and pulse pressure < 10 mmHg. The balloon inflation is held for >3 seconds after reaching full deployment volume. Once the balloon has fully deflated the balloon is retracted into the ascending aorta and valve function is assessed using echocardiography. There is felt to be no paravalvular leak and no central aortic insufficiency.  The patient's hemodynamic recovery following valve deployment is good.  The deployment balloon and guidewire are both removed.    PROCEDURE COMPLETION:   The sheath was removed and femoral artery closure performed.  Protamine was administered once femoral arterial repair was complete. The temporary pacemaker, pigtail catheters and femoral sheaths were removed with manual pressure used for hemostasis.  A Mynx femoral closure device was utilized following removal of the diagnostic sheath in the left femoral artery.  The patient tolerated the procedure well and is transported to the cath lab recovery area in stable condition. There were no immediate  intraoperative complications. All sponge instrument and needle counts are verified correct  at completion of the operation.   No blood products were administered during the operation.  The patient received a total of 20 mL of intravenous contrast during the procedure.   Alleen Borne, MD 08/21/2020 1:33 PM

## 2020-08-21 NOTE — Anesthesia Procedure Notes (Signed)
Arterial Line Insertion Start/End6/24/2022 9:45 AM, 08/21/2020 9:50 AM Performed by: Waynard Edwards, CRNA, CRNA  Patient location: Pre-op. Preanesthetic checklist: patient identified, IV checked, site marked, risks and benefits discussed, surgical consent, monitors and equipment checked, pre-op evaluation, timeout performed and anesthesia consent Lidocaine 1% used for infiltration Right, radial was placed Catheter size: 20 G Hand hygiene performed , maximum sterile barriers used  and Seldinger technique used Allen's test indicative of satisfactory collateral circulation Attempts: 2 Procedure performed without using ultrasound guided technique. Following insertion, dressing applied and Biopatch. Post procedure assessment: normal and unchanged  Patient tolerated the procedure well with no immediate complications.

## 2020-08-21 NOTE — CV Procedure (Signed)
HEART AND VASCULAR CENTER  TAVR OPERATIVE NOTE   Date of Procedure:  08/21/2020  Preoperative Diagnosis: Severe Aortic Stenosis   Postoperative Diagnosis: Same   Procedure:   Transcatheter Aortic Valve Replacement - Transfemoral Approach  Edwards Sapien 3 THV (size 23 mm, model # [id number], serial # [id number])   Co-Surgeons:  Lauree Chandler, MD and Gaye Pollack, MD   Anesthesiologist:  Stoltzfus  Echocardiographer:  Margaretann Loveless  Pre-operative Echo Findings: Severe aortic stenosis Normal left ventricular systolic function  Post-operative Echo Findings: No paravalvular leak Normal left ventricular systolic function  BRIEF CLINICAL NOTE AND INDICATIONS FOR SURGERY  80 yo female with history of coarctation repair at age 33, HTN, HLD, OSA and severe AS here today for TAVR. She is very active. Recent worsened dyspnea and fatigue. Echo with critical AS. Cardiac cath by me early June 2022 with non-obstructive CAD. Plans for TAVR today from transfemoral approach.   During the course of the patient's preoperative work up they have been evaluated comprehensively by a multidisciplinary team of specialists coordinated through the [facility] in the [facility].  They have been demonstrated to suffer from symptomatic severe aortic stenosis as noted above. The patient has been counseled extensively as to the relative risks and benefits of all options for the treatment of severe aortic stenosis including long term medical therapy, conventional surgery for aortic valve replacement, and transcatheter aortic valve replacement.  The patient has been independently evaluated by Dr. Roxy Manns and Dr. Cyndia Bent with CT surgery and they are felt to be at high risk for conventional surgical aortic valve replacement. The surgeon indicated the patient would be a poor candidate for conventional surgery. Based upon review of all of the patient's preoperative  diagnostic tests they are felt to be candidate for transcatheter aortic valve replacement using the transfemoral approach as an alternative to high risk conventional surgery.    Following the decision to proceed with transcatheter aortic valve replacement, a discussion has been held regarding what types of management strategies would be attempted intraoperatively in the event of life-threatening complications, including whether or not the patient would be considered a candidate for the use of cardiopulmonary bypass and/or conversion to open sternotomy for attempted surgical intervention.  The patient has been advised of a variety of complications that might develop peculiar to this approach including but not limited to risks of death, stroke, paravalvular leak, aortic dissection or other major vascular complications, aortic annulus rupture, device embolization, cardiac rupture or perforation, acute myocardial infarction, arrhythmia, heart block or bradycardia requiring permanent pacemaker placement, congestive heart failure, respiratory failure, renal failure, pneumonia, infection, other late complications related to structural valve deterioration or migration, or other complications that might ultimately cause a temporary or permanent loss of functional independence or other long term morbidity.  The patient provides full informed consent for the procedure as described and all questions were answered preoperatively.    DETAILS OF THE OPERATIVE PROCEDURE  PREPARATION:   The patient is brought to the operating room on the above mentioned date and central monitoring was established by the anesthesia team including placement of a radial arterial line. The patient is placed in the supine position on the operating table.  Intravenous antibiotics are administered. Conscious sedation is used.   Baseline transthoracic echocardiogram was performed. The patient's chest, abdomen, both groins, and both lower  extremities are prepared and draped in a sterile manner. A time out procedure is performed.   PERIPHERAL ACCESS:  Using the modified Seldinger technique, femoral arterial and venous access were obtained with placement of a 6 Fr sheath in the artery and a 7 Fr sheath in the vein on the left side using u/s guidance.  A pigtail diagnostic catheter was passed through the femoral arterial sheath under fluoroscopic guidance into the aortic root.  A temporary transvenous pacemaker catheter was passed through the femoral venous sheath under fluoroscopic guidance into the right ventricle.  The pacemaker was tested to ensure stable lead placement and pacemaker capture. Aortic root angiography was performed in order to determine the optimal angiographic angle for valve deployment.  TRANSFEMORAL ACCESS:  A micropuncture kit was used to gain access to the right femoral artery using u/s guidance. Position confirmed with angiography. Pre-closure with double ProGlide closure devices. The patient was heparinized systemically and ACT verified > 250 seconds.    A 14 Fr transfemoral E-sheath was introduced into the right femoral artery after progressively dilating over an Amplatz superstiff wire. An AL-1 catheter was used to direct a straight-tip exchange length wire across the native aortic valve into the left ventricle. This was exchanged out for a pigtail catheter and position was confirmed in the LV apex. Simultaneous LV and Ao pressures were recorded.  The pigtail catheter was then exchanged for an Amplatz Extra-stiff wire in the LV apex.   TRANSCATHETER HEART VALVE DEPLOYMENT:  An Edwards Sapien 3 THV (size 23 mm) was prepared and crimped per manufacturer's guidelines, and the proper orientation of the valve is confirmed on the [employer] delivery system. The valve was advanced through the introducer sheath using normal technique until in an appropriate position in the abdominal aorta beyond the sheath tip.  The balloon was then retracted and using the fine-tuning wheel was centered on the valve. The valve was then advanced across the aortic arch using appropriate flexion of the catheter. The valve was carefully positioned across the aortic valve annulus. The Commander catheter was retracted using normal technique. Once final position of the valve has been confirmed by angiographic assessment, the valve is deployed while temporarily holding ventilation and during rapid ventricular pacing to maintain systolic blood pressure < 50 mmHg and pulse pressure < 10 mmHg. The balloon inflation is held for >3 seconds after reaching full deployment volume. Once the balloon has fully deflated the balloon is retracted into the ascending aorta and valve function is assessed using TTE. There is felt to be no paravalvular leak and no central aortic insufficiency.  The patient's hemodynamic recovery following valve deployment is good.  The deployment balloon and guidewire are both removed. Echo demostrated acceptable post-procedural gradients, stable mitral valve function, and no AI.   PROCEDURE COMPLETION:  The sheath was then removed and closure devices were completed. Protamine was administered once femoral arterial repair was complete. The temporary pacemaker, pigtail catheters and femoral sheaths were removed with a Mynx closure device placed in the artery and manual pressure used for venous hemostasis.    The patient tolerated the procedure well and is transported to the surgical intensive care in stable condition. There were no immediate intraoperative complications. All sponge instrument and needle counts are verified correct at completion of the operation.   No blood products were administered during the operation.  The patient received a total of 20 mL of intravenous contrast during the procedure.  Lauree Chandler MD 08/21/2020 1:13 PM

## 2020-08-21 NOTE — Anesthesia Preprocedure Evaluation (Signed)
Anesthesia Evaluation  Patient identified by MRN, date of birth, ID band Patient awake    Reviewed: Allergy & Precautions, NPO status , Patient's Chart, lab work & pertinent test results  Airway Mallampati: II  TM Distance: >3 FB Neck ROM: Full    Dental  (+) Teeth Intact   Pulmonary sleep apnea and Continuous Positive Airway Pressure Ventilation ,    Pulmonary exam normal        Cardiovascular hypertension, Pt. on medications and Pt. on home beta blockers + Valvular Problems/Murmurs AS  Rhythm:Regular Rate:Normal + Systolic murmurs    Neuro/Psych negative neurological ROS  negative psych ROS   GI/Hepatic negative GI ROS, Neg liver ROS,   Endo/Other  negative endocrine ROS  Renal/GU negative Renal ROS  negative genitourinary   Musculoskeletal negative musculoskeletal ROS (+)   Abdominal (+)  Abdomen: soft. Bowel sounds: normal.  Peds  Hematology negative hematology ROS (+)   Anesthesia Other Findings   Reproductive/Obstetrics                            Anesthesia Physical Anesthesia Plan  ASA: 4  Anesthesia Plan: MAC   Post-op Pain Management:    Induction: Intravenous  PONV Risk Score and Plan: 2 and Propofol infusion, Ondansetron, Dexamethasone and Treatment may vary due to age or medical condition  Airway Management Planned: Simple Face Mask, Natural Airway and Nasal Cannula  Additional Equipment: Arterial line  Intra-op Plan:   Post-operative Plan:   Informed Consent: I have reviewed the patients History and Physical, chart, labs and discussed the procedure including the risks, benefits and alternatives for the proposed anesthesia with the patient or authorized representative who has indicated his/her understanding and acceptance.     Dental advisory given  Plan Discussed with: CRNA  Anesthesia Plan Comments: (ECHO 02/22: 1. Left ventricular ejection fraction, by  estimation, is 65 to 70%. The  left ventricle has hyperdynamic function. The left ventricle has no  regional wall motion abnormalities. There is severe concentric left  ventricular hypertrophy. Left ventricular  diastolic parameters are consistent with Grade I diastolic dysfunction  (impaired relaxation). The average left ventricular global longitudinal  strain is -6.5 %. The global longitudinal strain is abnormal.  2. Right ventricular systolic function is normal. The right ventricular  size is normal. There is normal pulmonary artery systolic pressure.  3. The mitral valve is normal in structure. No evidence of mitral valve  regurgitation. No evidence of mitral stenosis.  4. The aortic valve has an indeterminant number of cusps. There is severe  calcifcation of the aortic valve. Aortic valve regurgitation is not  visualized. Severe/critical aortic valve stenosis.  5. There is moderate dilatation of the ascending aorta, measuring 42 mm.  6. The inferior vena cava is normal in size with greater than 50%  respiratory variability, suggesting right atrial pressure of 3 mmHg.  Lab Results      Component                Value               Date                      WBC                      8.5                 08/21/2020  HGB                      11.8 (L)            08/21/2020                HCT                      37.4                08/21/2020                MCV                      98.2                08/21/2020                PLT                      165                 08/21/2020           Lab Results      Component                Value               Date                      NA                       140                 08/21/2020                K                        4.0                 08/21/2020                CO2                      20 (L)              08/21/2020                GLUCOSE                  122 (H)             08/21/2020                BUN                       35 (H)              08/21/2020                CREATININE               1.34 (H)            08/21/2020                CALCIUM                  9.4  08/21/2020                GFRNONAA                 40 (L)              08/21/2020          )        Anesthesia Quick Evaluation

## 2020-08-21 NOTE — Anesthesia Procedure Notes (Signed)
Procedure Name: MAC Date/Time: 08/21/2020 11:20 AM Performed by: Mariea Clonts, CRNA Pre-anesthesia Checklist: Patient identified, Emergency Drugs available, Suction available, Patient being monitored and Timeout performed Patient Re-evaluated:Patient Re-evaluated prior to induction Oxygen Delivery Method: Simple face mask

## 2020-08-21 NOTE — Interval H&P Note (Signed)
History and Physical Interval Note:  08/21/2020 7:14 AM  Stephanie Rosario  has presented today for surgery, with the diagnosis of Critical Aortic stenosis.  The various methods of treatment have been discussed with the patient and family. After consideration of risks, benefits and other options for treatment, the patient has consented to  Procedure(s): TRANSCATHETER AORTIC VALVE REPLACEMENT, TRANSFEMORAL (N/A) TRANSESOPHAGEAL ECHOCARDIOGRAM (TEE) (N/A) as a surgical intervention.  The patient's history has been reviewed, patient examined, no change in status, stable for surgery.  I have reviewed the patient's chart and labs.  Questions were answered to the patient's satisfaction.     Verne Carrow

## 2020-08-21 NOTE — Progress Notes (Signed)
  Echocardiogram 2D Echocardiogram limited has been performed.  Stephanie Rosario 08/21/2020, 1:05 PM

## 2020-08-22 ENCOUNTER — Inpatient Hospital Stay (HOSPITAL_COMMUNITY): Payer: Medicare Other

## 2020-08-22 DIAGNOSIS — Z952 Presence of prosthetic heart valve: Secondary | ICD-10-CM

## 2020-08-22 DIAGNOSIS — Z954 Presence of other heart-valve replacement: Secondary | ICD-10-CM

## 2020-08-22 DIAGNOSIS — I350 Nonrheumatic aortic (valve) stenosis: Secondary | ICD-10-CM

## 2020-08-22 LAB — BASIC METABOLIC PANEL
Anion gap: 7 (ref 5–15)
BUN: 31 mg/dL — ABNORMAL HIGH (ref 8–23)
CO2: 21 mmol/L — ABNORMAL LOW (ref 22–32)
Calcium: 8.9 mg/dL (ref 8.9–10.3)
Chloride: 109 mmol/L (ref 98–111)
Creatinine, Ser: 1.32 mg/dL — ABNORMAL HIGH (ref 0.44–1.00)
GFR, Estimated: 41 mL/min — ABNORMAL LOW (ref >60)
Glucose, Bld: 130 mg/dL — ABNORMAL HIGH (ref 70–99)
Potassium: 4 mmol/L (ref 3.5–5.1)
Sodium: 137 mmol/L (ref 135–145)

## 2020-08-22 LAB — CBC
HCT: 30.1 % — ABNORMAL LOW (ref 36.0–46.0)
Hemoglobin: 9.8 g/dL — ABNORMAL LOW (ref 12.0–15.0)
MCH: 31.3 pg (ref 26.0–34.0)
MCHC: 32.6 g/dL (ref 30.0–36.0)
MCV: 96.2 fL (ref 80.0–100.0)
Platelets: 140 10*3/uL — ABNORMAL LOW (ref 150–400)
RBC: 3.13 MIL/uL — ABNORMAL LOW (ref 3.87–5.11)
RDW: 13.6 % (ref 11.5–15.5)
WBC: 13.1 10*3/uL — ABNORMAL HIGH (ref 4.0–10.5)
nRBC: 0 % (ref 0.0–0.2)

## 2020-08-22 LAB — ECHOCARDIOGRAM COMPLETE
AR max vel: 1.42 cm2
AV Area VTI: 1.41 cm2
AV Area mean vel: 1.61 cm2
AV Mean grad: 10 mmHg
AV Peak grad: 23.6 mmHg
Ao pk vel: 2.43 m/s
Area-P 1/2: 2.87 cm2
Height: 62 in
S' Lateral: 2.4 cm
Weight: 2352.75 oz

## 2020-08-22 LAB — MAGNESIUM: Magnesium: 1.9 mg/dL (ref 1.7–2.4)

## 2020-08-22 MED ORDER — ASPIRIN 81 MG PO CHEW: 81.0000 mg | CHEWABLE_TABLET | Freq: Every day | ORAL | 2 refills | Status: DC

## 2020-08-22 MED ORDER — CLOPIDOGREL BISULFATE 75 MG PO TABS: 75.0000 mg | ORAL_TABLET | Freq: Every day | ORAL | 2 refills | Status: DC

## 2020-08-22 MED ORDER — ACETAMINOPHEN 325 MG PO TABS: 650.0000 mg | ORAL_TABLET | Freq: Four times a day (QID) | ORAL | Status: AC | PRN

## 2020-08-22 NOTE — Discharge Instructions (Signed)
Transcatheter Aortic Valve Replacement, Care After This sheet gives you information about how to care for yourself after your procedure. Your health care provider may also give you more specific instructions. If you have problems or questions, contact your health careprovider. What can I expect after the procedure? After the procedure, it is common to have: Pain around your incision areas. Bruising and a small lump near your catheter insertion areas as they heal. Tiredness (fatigue) as your body is healing. Follow these instructions at home: Medicines Take over-the-counter and prescription medicines only as told by your health care provider. Ask your health care provider if the medicine prescribed to you can cause constipation. You may need to take these actions to prevent or treat constipation: Take over-the-counter or prescription medicines. Eat foods that are high in fiber, such as beans, whole grains, and fresh fruits and vegetables. Limit foods that are high in fat and processed sugars, such as fried or sweet foods. If you are taking blood thinners: Talk with your health care provider before you take any medicines that contain aspirin or NSAIDs, such as ibuprofen. These medicines increase your risk for dangerous bleeding. Take your medicine exactly as told, at the same time every day. Avoid activities that could cause injury or bruising, and follow instructions about how to prevent falls. Wear a medical alert bracelet or carry a card that lists what medicines you take. If you were prescribed an antibiotic medicine, take it as told by your health care provider. Do not stop using the antibiotic even if you start to feel better. Eating and drinking     Follow instructions from your health care provider about eating or drinking restrictions. Eat a heart-healthy diet. This includes lean proteins, whole grains, and plenty of fresh fruits and vegetables. Avoid foods that are: High in salt  (sodium), saturated fat, or sugar. Check ingredients and nutrition facts on packaged foods and beverages. Canned or highly processed. Fried. Ask your health care provider if your fluid intake is restricted. Otherwise, drink enough fluid to keep your urine pale yellow. Alcohol use If you drink alcohol: Limit how much you use to: 0-1 drink a day for women who are not pregnant. 0-2 drinks a day for men. Be aware of how much alcohol is in your drink. In the [place] one drink equals one 12 oz bottle of beer (355 mL), one 5 oz glass of wine (148 mL), or one 1 oz glass of hard liquor (44 mL). Incision care  Follow instructions from your health care provider about how to take care of your incisions. Make sure you: Wash your hands with soap and water for at least 20 seconds before and after you change your bandage (dressing). If soap and water are not available, use hand sanitizer. Change your dressing as told by your health care provider. Leave stitches (sutures), glue, or adhesive strips in place. These skin closures may need to stay in place for 2 weeks or longer. If adhesive strip edges start to loosen and curl up, you may trim the loose edges. Do not remove adhesive strips completely unless your health care provider tells you to do that. Check your incision or puncture areas every day for signs of infection. Check for: More redness, swelling, or pain. Fluid or blood. Warmth. Pus or a bad smell.  Activity Rest as told by your health care provider. Take naps as needed throughout the day. Avoid sitting for a long time without moving. Be sure to get up and move   around one or more times every 1-2 hours. This is important to improve blood flow and breathing and to help prevent blood clots. Ask for help if you feel weak or unsteady. Avoid climbing stairs for about one week. Do not lift anything that is heavier than 10 lb (4.5 kg), or the limit that you are told, until your health care provider says  that it is safe. Ask your health care provider when it is safe for you to drive or use machinery. Do not drive until your health care provider approves. Return to your normal activities as told by your health care provider. Ask your health care provider what activities are safe for you. Exercise regularly, as told by your health care provider. Ask your health care provider if you will be doing a formal cardiac exercise program (cardiac rehabilitation). Lifestyle Do not use any products that contain nicotine or tobacco, such as cigarettes, e-cigarettes, and chewing tobacco. These can delay healing after surgery. If you need help quitting, ask your health care provider. Resume sexual activity as told by your health care provider. Do not use medicines for problems getting or keeping an erection (erectile dysfunction) unless your health care provider approves, if this applies. Work with your health care provider to manage your blood pressure and any other heart conditions that you have. Maintain a healthy weight. Future health care appointments Tell all your health care providers, including your dentist: That you have an artificial aortic valve. If you have or have had heart disease or endocarditis. To prevent infection, you may need to take antibiotics for dental procedures. General instructions Do not take baths, swim, or use a hot tub until your health care provider approves. You may take showers. Do not strain to have a bowel movement. Wear compression stockings as told by your health care provider. These stockings help to prevent blood clots and reduce swelling in your legs. Avoid airplane travel for as long as told. When you travel, bring along a list of your medicines and a record of your medical history. Keep all follow-up visits as told by your health care provider. This is important. Contact a health care provider if: You develop a skin rash. You have sudden, unexplained weight  changes. You have more redness, swelling, or pain around an incision area. An incision area feels warm to the touch. You have pus or a bad smell coming from an incision area. You have a fever. You have fast or irregular heartbeats (palpitations). Get help right away if: An incision area swells very quickly. An incision is bleeding, and the bleeding does not stop after pressure is applied. The area near or just beyond an incision tingles or becomes pale, cool, or numb. You develop chest pain. You develop shortness of breath or have trouble breathing. You are light-headed or you faint. These symptoms may represent a serious problem that is an emergency. Do not wait to see if the symptoms will go away. Get medical help right away. Call your local emergency services (911 in the [place] Do not drive yourself to the hospital. Summary After the procedure, it is common to have bruising and a lump around your catheter insertion site. Avoid climbing stairs for about one week after your procedure. Do not lift anything that is heavier than 10 lb (4.5 kg), or the limit that you are told, until your health care provider says that it is safe. Do not drive until your health care provider approves. Tell all your health care providers   that you have an artificial aortic valve. This information is not intended to replace advice given to you by your health care provider. Make sure you discuss any questions you have with your healthcare provider. Document Revised: 02/01/2019 Document Reviewed: 02/01/2019 Elsevier Patient Education  2022 [employer] 

## 2020-08-22 NOTE — Progress Notes (Signed)
Pt ambulated approx 100 ft in hall with standby assist.  HR up 99 during ambulation.  To recliner after walk, states "3 days ago I couldn't walk that far without passing out."  Call bell in lap and husband present.  Anticipate DC home this afternoon.

## 2020-08-22 NOTE — Progress Notes (Signed)
  Echocardiogram 2D Echocardiogram has been performed.  Stephanie Rosario F 08/22/2020, 11:18 AM

## 2020-08-22 NOTE — Progress Notes (Signed)
CARDIAC REHAB PHASE I   PRE:  Rate/Rhythm: 85 SR    BP: sitting 127/41    SaO2: 95 RA  MODE:  Ambulation: 80 ft   POST:  Rate/Rhythm: 109 ST    BP: sitting 119/51     SaO2: 96 RA  Pt c/o  pain lower stomach, esp with standing. Had not taken any pain meds. Handheld assist, c/o pain and possible swelling in left groin in hall therefore walked short distance and returned to recliner. Groin looks good, no swelling. Pt took pain med with plan to walk again later.   Discussed restrictions, walking at home and CRPII. Will refer to [place] CRPII. Pt and husband receptive with appropriate questions.  [id number]  Harriet Masson CES, ACSM 08/22/2020 9:47 AM

## 2020-08-22 NOTE — Discharge Summary (Signed)
Physician Discharge Summary  Patient ID: Stephanie Rosario MRN: [id number] DOB/AGE: [birth year] 80 y.o.  Admit date: 08/21/2020 Discharge date: 08/22/2020  Admission Diagnoses:  Severe aortic stenosis Hypertension Dyslipidemia History of sleep apnea Nuclear sclerotic cataract of both eyes Primary open angle glaucoma of both eyes, severe stage   Discharge Diagnoses:   Severe aortic stenosis S/P Transcatheter Aortic Valve Replacement  Hypertension Dyslipidemia History of sleep apnea Nuclear sclerotic cataract of both eyes Primary open angle glaucoma of both eyes, severe stage   Discharged Condition: stable  History of Present Illness:   Patient has stage D1 severe symptomatic aortic stenosis.  She describes classical symptoms of progressive exertional shortness of breath and chest discomfort over the past 2 to 3 years with recent symptoms consistent with chronic diastolic congestive heart failure and angina pectoris, New York Heart Association functional class III.  In addition, the patient has had several episodes of severe dizziness and near syncope all in association with physical exertion.  I have personally reviewed the patient's recent transthoracic echocardiogram, diagnostic cardiac catheterization, EKG, and CT angiograms.  Echocardiogram demonstrates the presence of Sievers type I bicuspid aortic valve with critical aortic stenosis.  There is a single raphae with moderate calcification but severely restricted leaflet mobility.  Peak velocity across aortic valve measured greater than 5.5 m/s corresponding to mean transvalvular gradient estimated 83 mmHg and aortic valve area calculated only 0.41 cm by VTI.  Left ventricular systolic function remains hyperdynamic but there is moderate to severe left ventricular hypertrophy.  Diagnostic cardiac catheterization confirmed the presence of severe aortic stenosis with peak to peak and mean transvalvular gradients measured 110 and 64 mmHg,  respectively, corresponding to aortic valve area calculated only 0.46 cm.  There was nonobstructive coronary artery disease.  EKG reveals sinus rhythm with no significant AV conduction delay.  Cardiac-gated CTA of the heart reveals anatomical characteristics consistent with aortic stenosis suitable for treatment by transcatheter aortic valve replacement without any significant complicating feature and CTA of the aorta and iliac vessels demonstrate what appears to be adequate pelvic vascular access to facilitate a transfemoral approach other than mild narrowing at the site of previous repair of coarctation.   During the course of the patient's preoperative work up they have been evaluated comprehensively by a multidisciplinary team of specialists coordinated through the Multidisciplinary Heart Valve Clinic in the [facility].  They have been demonstrated to suffer from symptomatic severe aortic stenosis as noted above. The patient has been counseled extensively as to the relative risks and benefits of all options for the treatment of severe aortic stenosis including long term medical therapy, conventional surgery for aortic valve replacement, and transcatheter aortic valve replacement.  All questions have been answered, and the patient provides full informed consent for the operation as described.    Hospital Course:   Patient was admitted for elective surgery on 08/21/2020.  She was taken to the OR where transcatheter aortic valve replacement was accomplished by right transfemoral approach.  The procedure was without complication.  The echocardiogram obtained prior to leaving the OR showed no paravalvular function and normal left ventricular function.  After recovery in the PACU, she was transferred to 4 E. progressive care.  She continued to make satisfactory recovery.  Diet and activity were advanced and well-tolerated.  The surgical site remained hemostatic.  Follow-up echo  obtained on the morning following the procedure again confirmed good LV function with no perivalvular leak.     Consults: cardiology  Significant Diagnostic Studies:   ECHOCARDIOGRAM REPORT         Patient Name:   Stephanie Rosario Date of Exam: 08/22/2020  Medical Rec #:  [id number]     Height:       62.0 in  Accession #:    [id number]    Weight:       147.0 lb  Date of Birth:  1940-08-24     BSA:          1.677 m  Patient Age:    80 years      BP:           119/51 mmHg  Patient Gender: F             HR:           81 bpm.  Exam Location:  Inpatient   Procedure: 2D Echo, Cardiac Doppler and Color Doppler   Indications:    Post TAVR     History:        Patient has prior history of Echocardiogram examinations,  most                  recent 08/21/2020.     Sonographer:    Roosvelt Maser RDCS  Referring Phys: [id number] CHRISTOPHER D MCALHANY   IMPRESSIONS     1. 23 mm S3 in the aortic position. V max 2.4 m/s, MG 10 mmHG, EOA 1.41  cm2, DI 0.37. No paravalvular leak. Normal prosthesis. The aortic valve  has been repaired/replaced. Aortic valve regurgitation is not visualized.  Procedure Date: 08/21/2020.   2. Hyperdynamic LV function, with intracavitary gradient ~13 mmHG. Left  ventricular ejection fraction, by estimation, is 70 to 75%. The left  ventricle has hyperdynamic function. The left ventricle has no regional  wall motion abnormalities. There is mild   concentric left ventricular hypertrophy. Left ventricular diastolic  parameters are consistent with Grade I diastolic dysfunction (impaired  relaxation).   3. Right ventricular systolic function is normal. The right ventricular  size is normal. Tricuspid regurgitation signal is inadequate for assessing  PA pressure.   4. The mitral valve is grossly normal. No evidence of mitral valve  regurgitation. No evidence of mitral stenosis.   FINDINGS   Left Ventricle: Hyperdynamic LV function, with intracavitary gradient ~13  mmHG. Left  ventricular ejection fraction, by estimation, is 70 to 75%. The  left ventricle has hyperdynamic function. The left ventricle has no  regional wall motion abnormalities.  The left ventricular internal cavity size was normal in size. There is  mild concentric left ventricular hypertrophy. Left ventricular diastolic  parameters are consistent with Grade I diastolic dysfunction (impaired  relaxation).   Right Ventricle: The right ventricular size is normal. No increase in  right ventricular wall thickness. Right ventricular systolic function is  normal. Tricuspid regurgitation signal is inadequate for assessing PA  pressure.   Left Atrium: Left atrial size was normal in size.   Right Atrium: Right atrial size was normal in size.   Pericardium: Trivial pericardial effusion is present. Presence of  pericardial fat pad.   Mitral Valve: The mitral valve is grossly normal. No evidence of mitral  valve regurgitation. No evidence of mitral valve stenosis.   Tricuspid Valve: The tricuspid valve is grossly normal. Tricuspid valve  regurgitation is trivial. No evidence of tricuspid stenosis.   Aortic Valve: 23 mm S3 in the aortic position. V max 2.4 m/s, MG 10 mmHG,  EOA 1.41 cm2, DI 0.37. No paravalvular  leak. Normal prosthesis. The aortic  valve has been repaired/replaced. Aortic valve regurgitation is not  visualized. Aortic valve mean  gradient measures 10.0 mmHg. Aortic valve peak gradient measures 23.6  mmHg. Aortic valve area, by VTI measures 1.41 cm. There is a 23 mm Sapien  prosthetic, stented (TAVR) valve present in the aortic position.   Pulmonic Valve: The pulmonic valve was grossly normal. Pulmonic valve  regurgitation is not visualized. No evidence of pulmonic stenosis.   Aorta: The aortic root is normal in size and structure.   Venous: The inferior vena cava was not well visualized.   IAS/Shunts: The atrial septum is grossly normal.      LEFT VENTRICLE  PLAX 2D   LVIDd:         3.90 cm  LVIDs:         2.40 cm  LV PW:         1.40 cm  LV IVS:        1.20 cm  LVOT diam:     2.20 cm  LV SV:         69  LV SV Index:   41  LVOT Area:     3.80 cm      RIGHT VENTRICLE  RV Basal diam:  4.00 cm   LEFT ATRIUM             Index       RIGHT ATRIUM           Index  LA diam:        3.40 cm 2.03 cm/m  RA Area:     14.00 cm  LA Vol (A2C):   59.2 ml 35.29 ml/m RA Volume:   34.70 ml  20.69 ml/m  LA Vol (A4C):   46.0 ml 27.42 ml/m  LA Biplane Vol: 52.7 ml 31.42 ml/m   AORTIC VALVE  AV Area (Vmax):    1.42 cm  AV Area (Vmean):   1.61 cm  AV Area (VTI):     1.41 cm  AV Vmax:           243.00 cm/s  AV Vmean:          138.500 cm/s  AV VTI:            0.490 m  AV Peak Grad:      23.6 mmHg  AV Mean Grad:      10.0 mmHg  LVOT Vmax:         90.70 cm/s  LVOT Vmean:        58.700 cm/s  LVOT VTI:          0.181 m  LVOT/AV VTI ratio: 0.37     AORTA  Ao Root diam: 2.30 cm  Ao Asc diam:  3.70 cm   MITRAL VALVE  MV Area (PHT): 2.87 cm     SHUNTS  MV Decel Time: 264 msec     Systemic VTI:  0.18 m  MV E velocity: 62.40 cm/s   Systemic Diam: 2.20 cm  MV A velocity: 113.00 cm/s  MV E/A ratio:  0.55   Lennie Odor MD  Electronically signed by Lennie Odor MD  Signature Date/Time: 08/22/2020/1:07:11 PM      Treatments:   TAVR OPERATIVE NOTE     Date of Procedure:                08/21/2020   Preoperative Diagnosis:      Severe Aortic Stenosis   Postoperative Diagnosis:  Same   Procedure:        Transcatheter Aortic Valve Replacement - Percutaneous Right Transfemoral Approach             Edwards Sapien 3 Ultra THV (size 23 mm, model # 9750TFX, serial # [id number])              Co-Surgeons:                        Alleen BorneBryan K. Bartle, MD and Verne Carrowhristopher McAlhany, MD     Anesthesiologist:                  Lavenia AtlasG. Stoltzfus   Echocardiographer:              Elspeth ChoG. Acharya   Pre-operative Echo Findings: Severe aortic stenosis Normal left ventricular  systolic function   Post-operative Echo Findings: No paravalvular leak Normal left ventricular systolic function   Discharge Exam: Blood pressure (!) 138/49, pulse 75, temperature 98.3 F (36.8 C), temperature source Oral, resp. rate (!) 21, height 5\' 2"  (1.575 m), weight 66.7 kg, SpO2 98 %.  General appearance: alert, cooperative, and no distress Neurologic: intact Heart: Regular rate and rhythm, no murmur. Lungs: Breath sounds clear to auscultation Abdomen: Soft and nontender Wound: Left femoral arterial access site is soft. There is some serous drainage on the dressing.  Disposition:  The patient is discharged to home in stable condition.   Discharge Instructions     Amb Referral to Cardiac Rehabilitation   Complete by: As directed    To [place]   Diagnosis: Valve Replacement   Valve: Aortic Comment - TAVR   After initial evaluation and assessments completed: Virtual Based Care may be provided alone or in conjunction with Phase 2 Cardiac Rehab based on patient barriers.: Yes      Allergies as of 08/22/2020       Reactions   Sulfa Antibiotics Rash        Medication List     TAKE these medications    acetaminophen 325 MG tablet Commonly known as: TYLENOL Take 2 tablets (650 mg total) by mouth every 6 (six) hours as needed for mild pain (or Fever >/= 101).   aspirin 81 MG chewable tablet Chew 1 tablet (81 mg total) by mouth daily. Start taking on: August 23, 2020   bisoprolol-hydrochlorothiazide 10-6.25 MG tablet Commonly known as: ZIAC Take 0.5 tablets by mouth daily.   brimonidine 0.2 % ophthalmic solution Commonly known as: ALPHAGAN Place 1 drop into both eyes 3 (three) times daily.   carboxymethylcellulose 0.5 % Soln Commonly known as: REFRESH PLUS Place 1 drop into both eyes See admin instructions. Instill 1 drop into both eyes 5 to 10 minutes before Rhopressa and 1 drop into both eyes 5 to 10 minutes after   clopidogrel 75 MG tablet Commonly  known as: PLAVIX Take 1 tablet (75 mg total) by mouth daily with breakfast. Start taking on: August 23, 2020   dorzolamide 2 % ophthalmic solution Commonly known as: TRUSOPT Place 1 drop into both eyes 3 (three) times daily.   fluticasone 50 MCG/ACT nasal spray Commonly known as: FLONASE Place 2 sprays into both nostrils as needed for congestion.   ibuprofen 200 MG tablet Commonly known as: ADVIL Take 200 mg by mouth every 8 (eight) hours as needed for headache or moderate pain.   ramipril 5 MG capsule Commonly known as: ALTACE Take 5 mg by mouth daily.   Rhopressa 0.02 %  Soln Generic drug: Netarsudil Dimesylate Place 1 drop into both eyes at bedtime.   triamterene-hydrochlorothiazide 37.5-25 MG tablet Commonly known as: MAXZIDE-25 Take 0.5 tablets by mouth daily.   Vyzulta 0.024 % Soln Generic drug: Latanoprostene Bunod Place 1 drop into both eyes at bedtime.        Follow-up Information     Janetta Hora, PA-C Follow up on 09/03/2020.   Specialties: Cardiology, Radiology Why: Your appointment is at 2:30pm. Contact information: [address] [place] [address] [phone]         Baldo Daub, MD Follow up on 09/10/2020.   Specialties: Cardiology, Radiology Why: Your appointment is at 4:20pm. Contact information: [address] [place] [address] [phone]                 Signed: Leary Roca, PA-C 08/22/2020, 2:15 PM

## 2020-08-22 NOTE — Progress Notes (Signed)
Pt discharging home with family.  All instructions given and reviewed, all questions answered.   °

## 2020-08-22 NOTE — Progress Notes (Addendum)
      [address]       Stephanie Rosario [address]             [phone]      1 Day Post-Op Procedure(s) (LRB): TRANSCATHETER AORTIC VALVE REPLACEMENT, TRANSFEMORAL (N/A) INTRAOPERATIVE TRANSTHORACIC ECHOCARDIOGRAM (N/A) Subjective: Up in bedside chair.  Says she feels good and is looking forward to going home today. The echo tech was preparing for planned echocardiography at the time of the exam.  Objective: Vital signs in last 24 hours: Temp:  [97.3 F (36.3 C)-98.4 F (36.9 C)] 98.3 F (36.8 C) (06/25 0800) Pulse Rate:  [52-83] 75 (06/25 0800) Cardiac Rhythm: Normal sinus rhythm (06/25 0700) Resp:  [12-26] 21 (06/25 0800) BP: (93-138)/(28-54) 138/49 (06/25 0800) SpO2:  [92 %-100 %] 98 % (06/25 0800) Arterial Line BP: (110-125)/(36-51) 118/43 (06/24 1410) Weight:  [66.5 kg-66.7 kg] 66.7 kg (06/25 0300)  Hemodynamic parameters for last 24 hours:    Intake/Output from previous day: 06/24 0701 - 06/25 0700 In: 900 [I.V.:900] Out: 300 [Urine:300] Intake/Output this shift: No intake/output data recorded.  General appearance: alert, cooperative, and no distress Neurologic: intact Heart: Regular rate and rhythm, no murmur. Lungs: Breath sounds clear to auscultation Abdomen: Soft and nontender Wound: Left femoral arterial access site is soft. There is some serous drainage on the dressing.  Lab Results: Recent Labs    08/21/20 0741 08/21/20 1337 08/22/20 0113  WBC 8.5  --  13.1*  HGB 11.8* 10.2* 9.8*  HCT 37.4 30.0* 30.1*  PLT 165  --  140*   BMET:  Recent Labs    08/21/20 0741 08/21/20 1337 08/22/20 0113  NA 140 142 137  K 4.0 4.0 4.0  CL 112* 111 109  CO2 20*  --  21*  GLUCOSE 122* 123* 130*  BUN 35* 28* 31*  CREATININE 1.34* 1.10* 1.32*  CALCIUM 9.4  --  8.9    PT/INR:  Recent Labs    08/21/20 0741  LABPROT 12.3  INR 0.9   ABG    Component Value Date/Time   PHART 7.330 (L) 08/21/2020 0806   HCO3 19.1 (L) 08/21/2020 0806    TCO2 20 (L) 08/21/2020 1337   ACIDBASEDEF 5.7 (H) 08/21/2020 0806   O2SAT 98.7 08/21/2020 0806   CBG (last 3)  No results for input(s): GLUCAP in the last 72 hours.  Assessment/Plan: S/P Procedure(s) (LRB): TRANSCATHETER AORTIC VALVE REPLACEMENT, TRANSFEMORAL (N/A) INTRAOPERATIVE TRANSTHORACIC ECHOCARDIOGRAM (N/A)  -Postop day 1 transcatheter aortic valve replacement for severe aortic stenosis.  Vital signs are stable.  She is ambulatory and tolerating p.o.'s.  We will plan for discharge later today pending findings of the echocardiogram.   LOS: 1 day    Leary Roca, PA-C [phone] 08/22/2020   Chart reviewed, patient examined, agree with above. She feels well and ambulating without shortness of breath. 2D echo reviewed and looks good. Rhythm is sinus with no high grade AV block. She can go home today with husband.

## 2020-08-23 ENCOUNTER — Encounter (HOSPITAL_COMMUNITY): Payer: Self-pay | Admitting: Cardiovascular Disease

## 2020-08-24 ENCOUNTER — Telehealth: Payer: Self-pay | Admitting: Physician Assistant

## 2020-08-24 LAB — POCT I-STAT, CHEM 8
BUN: 28 mg/dL — ABNORMAL HIGH (ref 8–23)
Calcium, Ion: 1.4 mmol/L (ref 1.15–1.40)
Chloride: 110 mmol/L (ref 98–111)
Creatinine, Ser: 1.1 mg/dL — ABNORMAL HIGH (ref 0.44–1.00)
Glucose, Bld: 126 mg/dL — ABNORMAL HIGH (ref 70–99)
HCT: 32 % — ABNORMAL LOW (ref 36.0–46.0)
Hemoglobin: 10.9 g/dL — ABNORMAL LOW (ref 12.0–15.0)
Potassium: 4 mmol/L (ref 3.5–5.1)
Sodium: 142 mmol/L (ref 135–145)
TCO2: 25 mmol/L (ref 22–32)

## 2020-08-24 LAB — POCT I-STAT 7, (LYTES, BLD GAS, ICA,H+H)
Acid-base deficit: 6 mmol/L — ABNORMAL HIGH (ref 0.0–2.0)
Bicarbonate: 21.8 mmol/L (ref 20.0–28.0)
Calcium, Ion: 1.4 mmol/L (ref 1.15–1.40)
HCT: 33 % — ABNORMAL LOW (ref 36.0–46.0)
Hemoglobin: 11.2 g/dL — ABNORMAL LOW (ref 12.0–15.0)
O2 Saturation: 99 %
Potassium: 4 mmol/L (ref 3.5–5.1)
Sodium: 143 mmol/L (ref 135–145)
TCO2: 23 mmol/L (ref 22–32)
pCO2 arterial: 54.6 mmHg — ABNORMAL HIGH (ref 32.0–48.0)
pH, Arterial: 7.209 — ABNORMAL LOW (ref 7.350–7.450)
pO2, Arterial: 145 mmHg — ABNORMAL HIGH (ref 83.0–108.0)

## 2020-08-24 NOTE — Telephone Encounter (Signed)
  [facility]   MULTIDISCIPLINARY HEART VALVE TEAM   Patient contacted regarding discharge from [facility] on 6/25  Patient understands to follow up with provider Carlean Jews on 7/7 at [facility].  Patient understands discharge instructions? yes Patient understands medications and regimen? yes Patient understands to bring all medications to this visit? yes  Cline Crock PA-C  [facility]

## 2020-08-25 NOTE — Anesthesia Postprocedure Evaluation (Signed)
Anesthesia Post Note  Patient: Aashi Derrington  Procedure(s) Performed: TRANSCATHETER AORTIC VALVE REPLACEMENT, TRANSFEMORAL (Chest) INTRAOPERATIVE TRANSTHORACIC ECHOCARDIOGRAM (Chest)     Patient location during evaluation: PACU Anesthesia Type: MAC Level of consciousness: awake and alert Pain management: pain level controlled Vital Signs Assessment: post-procedure vital signs reviewed and stable Respiratory status: spontaneous breathing, nonlabored ventilation, respiratory function stable and patient connected to nasal cannula oxygen Cardiovascular status: stable and blood pressure returned to baseline Postop Assessment: no apparent nausea or vomiting Anesthetic complications: no   No notable events documented.  Last Vitals:  Vitals:   08/22/20 0800 08/22/20 1230  BP: (!) 138/49 (!) 106/42  Pulse: 75 84  Resp: (!) 21 18  Temp: 36.8 C 36.9 C  SpO2: 98% 96%    Last Pain:  Vitals:   08/22/20 1230  TempSrc: Oral  PainSc:                  March Rummage Naasir Carreira

## 2020-08-27 ENCOUNTER — Ambulatory Visit: Payer: Medicare Other | Admitting: Cardiology

## 2020-08-28 ENCOUNTER — Telehealth (HOSPITAL_COMMUNITY): Payer: Self-pay

## 2020-08-28 DIAGNOSIS — I10 Essential (primary) hypertension: Secondary | ICD-10-CM | POA: Diagnosis not present

## 2020-08-28 DIAGNOSIS — Z952 Presence of prosthetic heart valve: Secondary | ICD-10-CM | POA: Diagnosis not present

## 2020-08-28 DIAGNOSIS — Z6827 Body mass index (BMI) 27.0-27.9, adult: Secondary | ICD-10-CM | POA: Diagnosis not present

## 2020-08-28 NOTE — Telephone Encounter (Signed)
Per phase I cardiac rehab, fax cardiac rehab referral to [place] [facility]. °

## 2020-09-03 ENCOUNTER — Encounter (INDEPENDENT_AMBULATORY_CARE_PROVIDER_SITE_OTHER): Payer: Self-pay

## 2020-09-03 ENCOUNTER — Other Ambulatory Visit: Payer: Self-pay

## 2020-09-03 ENCOUNTER — Encounter: Payer: Self-pay | Admitting: Physician Assistant

## 2020-09-03 ENCOUNTER — Ambulatory Visit (INDEPENDENT_AMBULATORY_CARE_PROVIDER_SITE_OTHER): Payer: Medicare Other | Admitting: Physician Assistant

## 2020-09-03 VITALS — BP 140/70 | HR 87 | Ht 62.0 in | Wt 146.8 lb

## 2020-09-03 DIAGNOSIS — I10 Essential (primary) hypertension: Secondary | ICD-10-CM | POA: Diagnosis not present

## 2020-09-03 DIAGNOSIS — M10272 Drug-induced gout, left ankle and foot: Secondary | ICD-10-CM

## 2020-09-03 DIAGNOSIS — I350 Nonrheumatic aortic (valve) stenosis: Secondary | ICD-10-CM | POA: Diagnosis not present

## 2020-09-03 MED ORDER — AMOXICILLIN 500 MG PO TABS: ORAL_TABLET | ORAL | 2 refills | Status: DC

## 2020-09-03 MED ORDER — PREDNISONE 10 MG PO TABS: ORAL_TABLET | ORAL | 0 refills | Status: DC

## 2020-09-03 NOTE — Patient Instructions (Addendum)
Medication Instructions:  Your physician has recommended you make the following change in your medication:   1.) stop triamterene/hctz (Maxzide)  2.) take prednisone 10 mg tablets -  take 4 tablets by mouth daily for 2 days then take 2 tablets by mouth daily for 2 days, then take 1 tablet daily for 2 days  3.) amoxicillin 500 mg tablets - take FOUR tablets (2000 mg) ONE HOUR before any dental procedures  *If you need a refill on your cardiac medications before your next appointment, please call your pharmacy*   Lab Work: none If you have labs (blood work) drawn today and your tests are completely normal, you will receive your results only by: MyChart Message (if you have MyChart) OR A paper copy in the mail If you have any lab test that is abnormal or we need to change your treatment, we will call you to review the results.   Testing/Procedures: none   Follow-Up: As planned   Other Instructions

## 2020-09-03 NOTE — Progress Notes (Addendum)
[facility]   MULTIDISCIPLINARY HEART VALVE CLINIC                                     Cardiology Office Note:    Date:  09/04/2020   ID:  Stephanie Rosario, DOB [birth year], MRN [id number]  PCP:  Marylen Ponto, MD  [facility] Cardiologist:  Norman Herrlich, MD / Dr. Clifton James & Dr. Laneta Simmers (TAVR) [facility] Electrophysiologist:  None   Referring MD: Marylen Ponto, MD    History of Present Illness:    Stephanie Rosario is a 80 y.o. female with a hx of coarctation of the aorta repaired at age 57, hypertension, hyperlipidemia, sleep apnea and critical AS s/p TAVR (08/21/20) who presents to clinic for follow up.   Patient states that she underwent uncomplicated repair of coarctation of the aorta at age 74 by Dr. Ria Bush at [facility].  She has been remarkably healthy and relatively physically active all of her adult life until recently.  Over the past 2 to 3 years she has developed progressive symptoms of exertional shortness of breath and fatigue.  Since this past Thanksgiving her symptoms have gotten much more severe such that she gets short of breath with very low level activity.  In addition she has had several episodes of near syncope that are always brought on with physical exertion.  She has had some chest tightness and chest pressure with exertion.  She has not had any resting shortness of breath or chest discomfort.  She has not had any history of orthopnea or lower extremity edema.  Patient was recently referred for cardiology consultation by her primary care physician and was initially evaluated by Dr. Dulce Sellar on Jul 24, 2020.  She was noted to have a prominent systolic murmur on physical exam and transthoracic echocardiogram revealed critical aortic stenosis with normal left ventricular systolic function.  She was promptly referred to the multidisciplinary heart valve clinic and underwent diagnostic cardiac catheterization by Dr. Clifton James.  Catheterization  revealed nonobstructive coronary artery disease and confirmed the presence of severe aortic stenosis.    She was evaluated by the multidisciplinary valve team and underwent successful TAVR with a 23 mm Edwards S3U via the TF approach on 08/21/20. Post op echo showed EF 70%, normally functioning TAVR with a mean gradient of 10 mmHg and no PVL. She was discharged on aspirin and plavix.    Today the patient presents to clinic for follow up. Having a gout flare in left medial ankle that is limiting her mobility. Has been having intermittent gout flares since being put on HCTZ.  No CP or SOB. No LE edema, orthopnea or PND. No dizziness or syncope. No blood in stool or urine. No palpitations. Feeling much better since TAVR aside from issues with gout.   Past Medical History:  Diagnosis Date   Aortic stenosis    Benign essential hypertension    Benign paroxysmal positional vertigo    History of nephrolithiasis    Nuclear sclerotic cataract of both eyes 12/30/2013   Primary open angle glaucoma of both eyes, severe stage 12/30/2013   Pure hypercholesterolemia    Sleep apnea    CPAP    Past Surgical History:  Procedure Laterality Date   BREAST BIOPSY     Benign   COARCTATION OF AORTA REPAIR     Age 54   INTRAOPERATIVE TRANSTHORACIC ECHOCARDIOGRAM N/A 08/21/2020  Procedure: INTRAOPERATIVE TRANSTHORACIC ECHOCARDIOGRAM;  Surgeon: Kathleene Hazel, MD;  Location: [facility] OR;  Service: Open Heart Surgery;  Laterality: N/A;   RIGHT/LEFT HEART CATH AND CORONARY ANGIOGRAPHY N/A 08/05/2020   Procedure: RIGHT/LEFT HEART CATH AND CORONARY ANGIOGRAPHY;  Surgeon: Kathleene Hazel, MD;  Location: [facility] INVASIVE CV LAB;  Service: Cardiovascular;  Laterality: N/A;   TRANSCATHETER AORTIC VALVE REPLACEMENT, TRANSFEMORAL N/A 08/21/2020   Procedure: TRANSCATHETER AORTIC VALVE REPLACEMENT, TRANSFEMORAL;  Surgeon: Kathleene Hazel, MD;  Location: [facility] OR;  Service: Open Heart Surgery;  Laterality: N/A;     Current Medications: Current Meds  Medication Sig   acetaminophen (TYLENOL) 325 MG tablet Take 2 tablets (650 mg total) by mouth every 6 (six) hours as needed for mild pain (or Fever >/= 101).   amoxicillin (AMOXIL) 500 MG tablet Take 4 tablets (2000 mg) by mouth ONE HOUR before any dental procedures   aspirin 81 MG chewable tablet Chew 1 tablet (81 mg total) by mouth daily.   bisoprolol-hydrochlorothiazide (ZIAC) 10-6.25 MG tablet Take 0.5 tablets by mouth daily.   brimonidine (ALPHAGAN) 0.2 % ophthalmic solution Place 1 drop into both eyes 3 (three) times daily.   carboxymethylcellulose (REFRESH PLUS) 0.5 % SOLN Place 1 drop into both eyes See admin instructions. Instill 1 drop into both eyes 5 to 10 minutes before Rhopressa and 1 drop into both eyes 5 to 10 minutes after   clopidogrel (PLAVIX) 75 MG tablet Take 1 tablet (75 mg total) by mouth daily with breakfast.   dorzolamide (TRUSOPT) 2 % ophthalmic solution Place 1 drop into both eyes 3 (three) times daily.   fluticasone (FLONASE) 50 MCG/ACT nasal spray Place 2 sprays into both nostrils as needed for congestion.   ibuprofen (ADVIL) 200 MG tablet Take 200 mg by mouth every 8 (eight) hours as needed for headache or moderate pain.   Latanoprostene Bunod (VYZULTA) 0.024 % SOLN Place 1 drop into both eyes at bedtime.   Netarsudil Dimesylate (RHOPRESSA) 0.02 % SOLN Place 1 drop into both eyes at bedtime.   predniSONE (DELTASONE) 10 MG tablet Take as directed by mouth.  Take 4 tablets once a day x 2 days, then take 2 tablets once a day x 2 days, then take 1 tablet once a day x 2 days   ramipril (ALTACE) 5 MG capsule Take 5 mg by mouth daily.   [DISCONTINUED] triamterene-hydrochlorothiazide (MAXZIDE-25) 37.5-25 MG tablet Take 0.5 tablets by mouth daily.     Allergies:   Sulfa antibiotics   Social History   Socioeconomic History   Marital status: Unknown    Spouse name: Not on file   Number of children: Not on file   Years of  education: Not on file   Highest education level: Not on file  Occupational History   Not on file  Tobacco Use   Smoking status: Never   Smokeless tobacco: Never  Vaping Use   Vaping Use: Never used  Substance and Sexual Activity   Alcohol use: Yes    Alcohol/week: 1.0 standard drink    Types: 1 Glasses of wine per week    Comment: Rare   Drug use: Never   Sexual activity: Not on file  Other Topics Concern   Not on file  Social History Narrative   Not on file   Social Determinants of Health   Financial Resource Strain: Not on file  Food Insecurity: Not on file  Transportation Needs: Not on file  Physical Activity: Not on file  Stress: Not  on file  Social Connections: Not on file     Family History: The patient's family history includes Congestive Heart Failure in her father; Heart attack in her mother; Hypertension in her sister.  ROS:   Please see the history of present illness.    All other systems reviewed and are negative.  EKGs/Labs/Other Studies Reviewed:    The following studies were reviewed today:  TAVR OPERATIVE NOTE     Date of Procedure:                08/21/2020   Preoperative Diagnosis:      Severe Aortic Stenosis   Postoperative Diagnosis:    Same   Procedure:        Transcatheter Aortic Valve Replacement - Percutaneous Right Transfemoral Approach             Edwards Sapien 3 Ultra THV (size 23 mm, model # 9750TFX, serial # [id number])              Co-Surgeons:                        Alleen Borne, MD and Verne Carrow, MD     Anesthesiologist:                  Lavenia Atlas   Echocardiographer:              Elspeth Cho   Pre-operative Echo Findings: Severe aortic stenosis Normal left ventricular systolic function   Post-operative Echo Findings: No paravalvular leak Normal left ventricular systolic function   _______________________   Echo 08/22/20 IMPRESSIONS   1. 23 mm S3 in the aortic position. V max 2.4 m/s, MG 10 mmHG,  EOA 1.41  cm2, DI 0.37. No paravalvular leak. Normal prosthesis. The aortic valve  has been repaired/replaced. Aortic valve regurgitation is not visualized.  Procedure Date: 08/21/2020.   2. Hyperdynamic LV function, with intracavitary gradient ~13 mmHG. Left  ventricular ejection fraction, by estimation, is 70 to 75%. The left  ventricle has hyperdynamic function. The left ventricle has no regional  wall motion abnormalities. There is mild   concentric left ventricular hypertrophy. Left ventricular diastolic  parameters are consistent with Grade I diastolic dysfunction (impaired  relaxation).   3. Right ventricular systolic function is normal. The right ventricular  size is normal. Tricuspid regurgitation signal is inadequate for assessing  PA pressure.   4. The mitral valve is grossly normal. No evidence of mitral valve  regurgitation. No evidence of mitral stenosis.    EKG:  EKG is ordered today.  The ekg ordered today demonstrates  sinus with 1st deg AV block HR 87 bpm  Recent Labs: 08/21/2020: ALT 15 08/22/2020: BUN 31; Creatinine, Ser 1.32; Hemoglobin 9.8; Magnesium 1.9; Platelets 140; Potassium 4.0; Sodium 137  Recent Lipid Panel No results found for: CHOL, TRIG, HDL, CHOLHDL, VLDL, LDLCALC, LDLDIRECT   Risk Assessment/Calculations:       Physical Exam:    VS:  BP 140/70   Pulse 87   Ht 5\' 2"  (1.575 m)   Wt 146 lb 12.8 oz (66.6 kg)   SpO2 97%   BMI 26.85 kg/m     Wt Readings from Last 3 Encounters:  09/03/20 146 lb 12.8 oz (66.6 kg)  08/22/20 147 lb 0.8 oz (66.7 kg)  08/17/20 150 lb 4.8 oz (68.2 kg)     GEN:  Well nourished, well developed in no acute distress HEENT: Normal NECK: No JVD  LYMPHATICS: No lymphadenopathy CARDIAC: RRR, soft flow murmurs. No rubs, gallops RESPIRATORY:  Clear to auscultation without rales, wheezing or rhonchi  ABDOMEN: Soft, non-tender, non-distended MUSCULOSKELETAL:  No edema; No deformity  SKIN: Warm and dry.  Groin sites clear  without hematoma or ecchymosis. Left medial ankle tender, swollen, red and warm. NEUROLOGIC:  Alert and oriented x 3 PSYCHIATRIC:  Normal affect   ASSESSMENT:    1. Severe aortic stenosis   2. Benign essential hypertension   3. Acute drug-induced gout of left ankle    PLAN:    In order of problems listed above:  Severe AS s/p TAVR: doing excellent 1.5 weeks out from TAVR. Groin sites healing well. ECG with no HAVB. Continue on aspirin and plavix. SBE prophylaxis discussed; I have RX'd amoxicillin. I will see her back for 1 month follow up and echo.   HTN: BP borderline today. She says it runs better at home. Currently on 1/2 tabs of Ziac 10-6.25mg  daily, 1/2 tabs HCTZ-triamterene 25mg  daily, and ramipril 5mg  daily. Given ongoing issues with gout and on two different medications with HCTZ, will go ahead and stop HCTZ-triamterene now. She will keep a log of her BPs to bring to her next apt with Dr. . I offered to increase her ramipril but she says this causes her a dry cough. I offered to change her to an ARB but she prefers to discuss with Dr. . She understands that steroids can also elevate BP.  Acute gout flare: she has been having ongoing issues with gout. Today she has an acute flare in left medial ankle. She usually uses high dose NSAIDS, but this is contraindicated given need for DAPT after TAVR. Also has had severe diarrhea with colchicine in the past. I will start her on a prednisone taper today and told her she can try some Advil sparingly as throbbing pain has been keeping her up at night.  As above, stopping HCTZ to see if this helps reduce gout flares.    Medication Adjustments/Labs and Tests Ordered: Current medicines are reviewed at length with the patient today.  Concerns regarding medicines are outlined above.  Orders Placed This Encounter  Procedures   EKG 12-Lead    Meds ordered this encounter  Medications   amoxicillin (AMOXIL) 500 MG tablet    Sig: Take 4  tablets (2000 mg) by mouth ONE HOUR before any dental procedures    Dispense:  12 tablet    Refill:  2   predniSONE (DELTASONE) 10 MG tablet    Sig: Take as directed by mouth.  Take 4 tablets once a day x 2 days, then take 2 tablets once a day x 2 days, then take 1 tablet once a day x 2 days    Dispense:  14 tablet    Refill:  0     Patient Instructions  Medication Instructions:  Your physician has recommended you make the following change in your medication:   1.) stop triamterene/hctz (Maxzide)  2.) take prednisone 10 mg tablets -  take 4 tablets by mouth daily for 2 days then take 2 tablets by mouth daily for 2 days, then take 1 tablet daily for 2 days  3.) amoxicillin 500 mg tablets - take FOUR tablets (2000 mg) ONE HOUR before any dental procedures  *If you need a refill on your cardiac medications before your next appointment, please call your pharmacy*   Lab Work: none If you have labs (blood work) drawn today and your tests are  completely normal, you will receive your results only by: MyChart Message (if you have MyChart) OR A paper copy in the mail If you have any lab test that is abnormal or we need to change your treatment, we will call you to review the results.   Testing/Procedures: none   Follow-Up: As planned   Other Instructions     Signed, Cline CrockKathryn Estaban [place], PA-C  09/04/2020 5:49 AM    [facility]

## 2020-09-09 DIAGNOSIS — I350 Nonrheumatic aortic (valve) stenosis: Secondary | ICD-10-CM | POA: Insufficient documentation

## 2020-09-10 ENCOUNTER — Ambulatory Visit (INDEPENDENT_AMBULATORY_CARE_PROVIDER_SITE_OTHER): Payer: Medicare Other | Admitting: Cardiology

## 2020-09-10 ENCOUNTER — Ambulatory Visit: Payer: Medicare Other | Admitting: Cardiology

## 2020-09-10 ENCOUNTER — Other Ambulatory Visit: Payer: Self-pay

## 2020-09-10 ENCOUNTER — Encounter: Payer: Self-pay | Admitting: Cardiology

## 2020-09-10 VITALS — BP 180/76 | HR 86 | Ht 62.0 in | Wt 146.0 lb

## 2020-09-10 DIAGNOSIS — I10 Essential (primary) hypertension: Secondary | ICD-10-CM

## 2020-09-10 DIAGNOSIS — Z952 Presence of prosthetic heart valve: Secondary | ICD-10-CM | POA: Diagnosis not present

## 2020-09-10 MED ORDER — VALSARTAN 40 MG PO TABS: 40.0000 mg | ORAL_TABLET | Freq: Every day | ORAL | 3 refills | Status: DC

## 2020-09-10 NOTE — Patient Instructions (Signed)
Medication Instructions:  Your physician has recommended you make the following change in your medication:  STOP: Ramipril  START: Valsartan 40 mg once daily  *If you need a refill on your cardiac medications before your next appointment, please call your pharmacy*   Lab Work: None If you have labs (blood work) drawn today and your tests are completely normal, you will receive your results only by: MyChart Message (if you have MyChart) OR A paper copy in the mail If you have any lab test that is abnormal or we need to change your treatment, we will call you to review the results.   Testing/Procedures: None   Follow-Up: At [facility], you and your health needs are our priority.  As part of our continuing mission to provide you with exceptional heart care, we have created designated Provider Care Teams.  These Care Teams include your primary Cardiologist (physician) and Advanced Practice Providers (APPs -  Physician Assistants and Nurse Practitioners) who all work together to provide you with the care you need, when you need it.  We recommend signing up for the patient portal called "MyChart".  Sign up information is provided on this After Visit Summary.  MyChart is used to connect with patients for Virtual Visits (Telemedicine).  Patients are able to view lab/test results, encounter notes, upcoming appointments, etc.  Non-urgent messages can be sent to your provider as well.   To learn more about what you can do with MyChart, go to [email or web address].    Your next appointment:   8 week(s)  The format for your next appointment:   In Person  Provider:   Norman Herrlich, MD   Other Instructions

## 2020-09-10 NOTE — Progress Notes (Signed)
Cardiology Office Note:    Date:  09/10/2020   ID:  Stephanie Rosario, DOB [birth year], MRN [id number]  PCP:  Marylen Ponto, MD  Cardiologist:  Norman Herrlich, MD  Electrophysiologist:  None   Referring MD: Marylen Ponto, MD   Chief Complaint  Patient presents with   Follow-up    History of Present Illness:    Stephanie Rosario is a 80 y.o. female with a hx of coarctation of the aorta.  At age 45, hypertension, hyperlipidemia, sleep apnea, aortic stenosis status post TAVR with a 23 mm Edwards SAPIEN 3 ultra THV valve which was done on August 21, 2020 is here today for follow-up visit.  The patient follows with Dr. Dulce Sellar and was last seen by him on July 30, 2020 since that time she has had her TAVR.  She did follow-up with the structural clinic and was recently seen by Rollene Rotunda, PA.  During that visit she was experiencing gout flare in her left medial ankle which had been limiting her significantly.  Her triamterene-HCTZ was stopped during her visit.  She was also given steroid taper which she has recently completed.  She tells me that the flare has improved but not completely resolved.   Past Medical History:  Diagnosis Date   Aortic stenosis    Benign essential hypertension    Benign paroxysmal positional vertigo    History of nephrolithiasis    Nuclear sclerotic cataract of both eyes 12/30/2013   Primary open angle glaucoma of both eyes, severe stage 12/30/2013   Pure hypercholesterolemia    Severe aortic stenosis    Shortness of breath 07/24/2020   Sleep apnea    CPAP    Past Surgical History:  Procedure Laterality Date   BREAST BIOPSY     Benign   COARCTATION OF AORTA REPAIR     Age 89   INTRAOPERATIVE TRANSTHORACIC ECHOCARDIOGRAM N/A 08/21/2020   Procedure: INTRAOPERATIVE TRANSTHORACIC ECHOCARDIOGRAM;  Surgeon: Kathleene Hazel, MD;  Location: [facility] OR;  Service: Open Heart Surgery;  Laterality: N/A;   RIGHT/LEFT HEART CATH AND CORONARY ANGIOGRAPHY N/A 08/05/2020    Procedure: RIGHT/LEFT HEART CATH AND CORONARY ANGIOGRAPHY;  Surgeon: Kathleene Hazel, MD;  Location: [facility] INVASIVE [facility];  Service: Cardiovascular;  Laterality: N/A;   TRANSCATHETER AORTIC VALVE REPLACEMENT, TRANSFEMORAL N/A 08/21/2020   Procedure: TRANSCATHETER AORTIC VALVE REPLACEMENT, TRANSFEMORAL;  Surgeon: Kathleene Hazel, MD;  Location: [facility] OR;  Service: Open Heart Surgery;  Laterality: N/A;    Current Medications: Current Meds  Medication Sig   acetaminophen (TYLENOL) 325 MG tablet Take 2 tablets (650 mg total) by mouth every 6 (six) hours as needed for mild pain (or Fever >/= 101).   amoxicillin (AMOXIL) 500 MG tablet Take 4 tablets (2000 mg) by mouth ONE HOUR before any dental procedures   aspirin 81 MG chewable tablet Chew 1 tablet (81 mg total) by mouth daily.   bisoprolol-hydrochlorothiazide (ZIAC) 10-6.25 MG tablet Take 0.5 tablets by mouth daily.   brimonidine (ALPHAGAN) 0.2 % ophthalmic solution Place 1 drop into both eyes 3 (three) times daily.   carboxymethylcellulose (REFRESH TEARS) 0.5 % SOLN Place 1 drop into both eyes daily.   clopidogrel (PLAVIX) 75 MG tablet Take 1 tablet (75 mg total) by mouth daily with breakfast.   dorzolamide (TRUSOPT) 2 % ophthalmic solution Place 1 drop into both eyes 3 (three) times daily.   fluticasone (FLONASE) 50 MCG/ACT nasal spray Place 2 sprays into both nostrils as needed for congestion.   ibuprofen (  ADVIL) 200 MG tablet Take 200 mg by mouth every 8 (eight) hours as needed for headache or moderate pain.   Latanoprostene Bunod (VYZULTA) 0.024 % SOLN Place 1 drop into both eyes at bedtime.   Netarsudil Dimesylate (RHOPRESSA) 0.02 % SOLN Place 1 drop into both eyes at bedtime.   valsartan (DIOVAN) 40 MG tablet Take 1 tablet (40 mg total) by mouth daily.   [DISCONTINUED] ramipril (ALTACE) 10 MG capsule Take 10 mg by mouth daily.     Allergies:   Sulfa antibiotics   Social History   Socioeconomic History   Marital status:  Unknown    Spouse name: Not on file   Number of children: Not on file   Years of education: Not on file   Highest education level: Not on file  Occupational History   Not on file  Tobacco Use   Smoking status: Never   Smokeless tobacco: Never  Vaping Use   Vaping Use: Never used  Substance and Sexual Activity   Alcohol use: Yes    Alcohol/week: 1.0 standard drink    Types: 1 Glasses of wine per week    Comment: Rare   Drug use: Never   Sexual activity: Not on file  Other Topics Concern   Not on file  Social History Narrative   Not on file   Social Determinants of Health   Financial Resource Strain: Not on file  Food Insecurity: Not on file  Transportation Needs: Not on file  Physical Activity: Not on file  Stress: Not on file  Social Connections: Not on file     Family History: The patient's family history includes Congestive Heart Failure in her father; Heart attack in her mother; Hypertension in her sister.  ROS:   Review of Systems  Constitution: Negative for decreased appetite, fever and weight gain.  HENT: Negative for congestion, ear discharge, hoarse voice and sore throat.   Eyes: Negative for discharge, redness, vision loss in right eye and visual halos.  Cardiovascular: Negative for chest pain, dyspnea on exertion, leg swelling, orthopnea and palpitations.  Respiratory: Negative for cough, hemoptysis, shortness of breath and snoring.   Endocrine: Negative for heat intolerance and polyphagia.  Hematologic/Lymphatic: Negative for bleeding problem. Does not bruise/bleed easily.  Skin: Negative for flushing, nail changes, rash and suspicious lesions.  Musculoskeletal: Negative for arthritis, joint pain, muscle cramps, myalgias, neck pain and stiffness.  Gastrointestinal: Negative for abdominal pain, bowel incontinence, diarrhea and excessive appetite.  Genitourinary: Negative for decreased libido, genital sores and incomplete emptying.  Neurological: Negative  for brief paralysis, focal weakness, headaches and loss of balance.  Psychiatric/Behavioral: Negative for altered mental status, depression and suicidal ideas.  Allergic/Immunologic: Negative for HIV exposure and persistent infections.    EKGs/Labs/Other Studies Reviewed:    The following studies were reviewed today:   EKG: None today  Recent Labs: 08/21/2020: ALT 15 08/22/2020: BUN 31; Creatinine, Ser 1.32; Hemoglobin 9.8; Magnesium 1.9; Platelets 140; Potassium 4.0; Sodium 137  Recent Lipid Panel No results found for: CHOL, TRIG, HDL, CHOLHDL, VLDL, LDLCALC, LDLDIRECT  Physical Exam:    VS:  BP (!) 180/76 (BP Location: Right Arm, Patient Position: Sitting, Cuff Size: Normal)   Pulse 86   Ht 5\' 2"  (1.575 m)   Wt 146 lb (66.2 kg)   SpO2 95%   BMI 26.70 kg/m     Wt Readings from Last 3 Encounters:  09/10/20 146 lb (66.2 kg)  09/03/20 146 lb 12.8 oz (66.6 kg)  08/22/20 147  lb 0.8 oz (66.7 kg)     GEN: Well nourished, well developed in no acute distress HEENT: Normal NECK: No JVD; No carotid bruits LYMPHATICS: No lymphadenopathy CARDIAC: S1S2 noted,RRR, no murmurs, rubs, gallops RESPIRATORY:  Clear to auscultation without rales, wheezing or rhonchi  ABDOMEN: Soft, non-tender, non-distended, +bowel sounds, no guarding. EXTREMITIES: No edema, No cyanosis, no clubbing MUSCULOSKELETAL:  No deformity  SKIN: Warm and dry NEUROLOGIC:  Alert and oriented x 3, non-focal PSYCHIATRIC:  Normal affect, good insight  ASSESSMENT:    1. Hypertension, unspecified type   2. S/P TAVR (transcatheter aortic valve replacement)    PLAN:     1.  She is hypertensive in the office today.  We talked about multiple options she reluctantly agreed to transition from ramipril to valsartan 40 mg.  She also is going to take her blood pressure daily and she tells me usually at home her systolic blood pressure is usually 140 mmHg.  2.  She also had questions about her dual antiplatelet use we  discussed I explained to the patient as well that she will need to continue her dual antiplatelet therapy from the day of her surgery for 6 months and then after that time she will need to be on aspirin 81 mg daily.   The patient is in agreement with the above plan. The patient left the office in stable condition.  The patient will follow up with Dr. Dulce Sellar.   Medication Adjustments/Labs and Tests Ordered: Current medicines are reviewed at length with the patient today.  Concerns regarding medicines are outlined above.  No orders of the defined types were placed in this encounter.  Meds ordered this encounter  Medications   valsartan (DIOVAN) 40 MG tablet    Sig: Take 1 tablet (40 mg total) by mouth daily.    Dispense:  90 tablet    Refill:  3    Patient Instructions  Medication Instructions:  Your physician has recommended you make the following change in your medication:  STOP: Ramipril  START: Valsartan 40 mg once daily  *If you need a refill on your cardiac medications before your next appointment, please call your pharmacy*   Lab Work: None If you have labs (blood work) drawn today and your tests are completely normal, you will receive your results only by: MyChart Message (if you have MyChart) OR A paper copy in the mail If you have any lab test that is abnormal or we need to change your treatment, we will call you to review the results.   Testing/Procedures: None   Follow-Up: At [facility], you and your health needs are our priority.  As part of our continuing mission to provide you with exceptional heart care, we have created designated Provider Care Teams.  These Care Teams include your primary Cardiologist (physician) and Advanced Practice Providers (APPs -  Physician Assistants and Nurse Practitioners) who all work together to provide you with the care you need, when you need it.  We recommend signing up for the patient portal called "MyChart".  Sign up  information is provided on this After Visit Summary.  MyChart is used to connect with patients for Virtual Visits (Telemedicine).  Patients are able to view lab/test results, encounter notes, upcoming appointments, etc.  Non-urgent messages can be sent to your provider as well.   To learn more about what you can do with MyChart, go to [email or web address].    Your next appointment:   8 week(s)  The format for your  next appointment:   In Person  Provider:   Norman Herrlich, MD   Other Instructions    Adopting a Healthy Lifestyle.  Know what a healthy weight is for you (roughly BMI <25) and aim to maintain this   Aim for 7+ servings of fruits and vegetables daily   65-80+ fluid ounces of water or unsweet tea for healthy kidneys   Limit to max 1 drink of alcohol per day; avoid smoking/tobacco   Limit animal fats in diet for cholesterol and heart health - choose grass fed whenever available   Avoid highly processed foods, and foods high in saturated/trans fats   Aim for low stress - take time to unwind and care for your mental health   Aim for 150 min of moderate intensity exercise weekly for heart health, and weights twice weekly for bone health   Aim for 7-9 hours of sleep daily   When it comes to diets, agreement about the perfect plan isnt easy to find, even among the experts. Experts at the [facility] [employer] developed an idea known as the Healthy Eating Plate. Just imagine a plate divided into logical, healthy portions.   The emphasis is on diet quality:   Load up on vegetables and fruits - one-half of your plate: Aim for color and variety, and remember that potatoes dont count.   Go for whole grains - one-quarter of your plate: Whole wheat, barley, wheat berries, quinoa, oats, brown rice, and foods made with them. If you want pasta, go with whole wheat pasta.   Protein power - one-quarter of your plate: Fish, chicken, beans, and nuts are all healthy,  versatile protein sources. Limit red meat.   The diet, however, does go beyond the plate, offering a few other suggestions.   Use healthy plant oils, such as olive, canola, soy, corn, sunflower and peanut. Check the labels, and avoid partially hydrogenated oil, which have unhealthy trans fats.   If youre thirsty, drink water. Coffee and tea are good in moderation, but skip sugary drinks and limit milk and dairy products to one or two daily servings.   The type of carbohydrate in the diet is more important than the amount. Some sources of carbohydrates, such as vegetables, fruits, whole grains, and beans-are healthier than others.   Finally, stay active  Signed, Thomasene Ripple, DO  09/10/2020 8:45 PM    [facility]

## 2020-09-14 DIAGNOSIS — H353132 Nonexudative age-related macular degeneration, bilateral, intermediate dry stage: Secondary | ICD-10-CM | POA: Diagnosis not present

## 2020-09-14 DIAGNOSIS — H401133 Primary open-angle glaucoma, bilateral, severe stage: Secondary | ICD-10-CM | POA: Diagnosis not present

## 2020-09-14 DIAGNOSIS — H25813 Combined forms of age-related cataract, bilateral: Secondary | ICD-10-CM | POA: Diagnosis not present

## 2020-09-15 ENCOUNTER — Telehealth: Payer: Self-pay | Admitting: Physician Assistant

## 2020-09-15 NOTE — Telephone Encounter (Signed)
    Pt c/o BP issue: STAT if pt c/o blurred vision, one-sided weakness or slurred speech  1. What are your last 5 BP readings? 140s-150s  2. Are you having any other symptoms (ex. Dizziness, headache, blurred vision, passed out)? A little dizzy  3. What is your BP issue? Pt said her BP been running high and she is a little concern, she wanted to speak with Cline Crock, she gave cell# [phone]

## 2020-09-15 NOTE — Telephone Encounter (Signed)
Left message on patients voicemail to please return our call.   

## 2020-09-15 NOTE — Telephone Encounter (Signed)
Spoke to the patient just now and she let me know that her blood pressure this morning was 157/77, and 143/71. She felt dizzy at this time as well. Both of these readings are before she takes her medications in the morning. She states that her blood pressure after taking her medicine is in the 120's on top. I advised that we normally request that she take her medications and then take her blood pressure about an hour later. She is very unhappy with hearing this as she says that normally her blood pressure is in the 120's in the morning even before taking her medications.   She takes her Valsartan 40 mg daily in the morning, this was just started last week by Dr. Servando Salina.   Her heart rate has been in the 80's and she is concerned about this because she states it is normally about 72 bpm. I advised that it was normal for her heart rate to be in the 80's but she is unhappy with this answer as well.   Denies any SOB, Chest pain, or any other symptoms not noted above.

## 2020-09-15 NOTE — Telephone Encounter (Signed)
Spoke to patient just now and let her know Dr. Munley's recommendations. She verbalizes understanding.    Encouraged patient to call back with any questions or concerns.  

## 2020-09-16 ENCOUNTER — Ambulatory Visit (INDEPENDENT_AMBULATORY_CARE_PROVIDER_SITE_OTHER): Payer: Medicare Other | Admitting: Physician Assistant

## 2020-09-16 ENCOUNTER — Ambulatory Visit (HOSPITAL_COMMUNITY): Payer: Medicare Other | Attending: Cardiology

## 2020-09-16 ENCOUNTER — Encounter: Payer: Self-pay | Admitting: Physician Assistant

## 2020-09-16 ENCOUNTER — Other Ambulatory Visit: Payer: Self-pay

## 2020-09-16 VITALS — BP 150/68 | HR 87 | Ht 62.0 in | Wt 147.0 lb

## 2020-09-16 DIAGNOSIS — I779 Disorder of arteries and arterioles, unspecified: Secondary | ICD-10-CM | POA: Diagnosis not present

## 2020-09-16 DIAGNOSIS — M10272 Drug-induced gout, left ankle and foot: Secondary | ICD-10-CM

## 2020-09-16 DIAGNOSIS — I712 Thoracic aortic aneurysm, without rupture, unspecified: Secondary | ICD-10-CM

## 2020-09-16 DIAGNOSIS — I10 Essential (primary) hypertension: Secondary | ICD-10-CM | POA: Diagnosis not present

## 2020-09-16 DIAGNOSIS — Z952 Presence of prosthetic heart valve: Secondary | ICD-10-CM | POA: Diagnosis not present

## 2020-09-16 DIAGNOSIS — I350 Nonrheumatic aortic (valve) stenosis: Secondary | ICD-10-CM | POA: Diagnosis not present

## 2020-09-16 LAB — ECHOCARDIOGRAM COMPLETE
AR max vel: 1.13 cm2
AV Area VTI: 1.33 cm2
AV Area mean vel: 0.93 cm2
AV Mean grad: 15.5 mmHg
AV Peak grad: 29.1 mmHg
Ao pk vel: 2.7 m/s
Area-P 1/2: 4.49 cm2
S' Lateral: 1.9 cm

## 2020-09-16 MED ORDER — PERFLUTREN LIPID MICROSPHERE
1.0000 mL | INTRAVENOUS | Status: AC | PRN
Start: 2020-09-16 — End: 2020-09-16
  Administered 2020-09-16: 2 mL via INTRAVENOUS

## 2020-09-16 NOTE — Patient Instructions (Signed)
Medication Instructions:  Your physician recommends that you continue on your current medications as directed. Please refer to the Current Medication list given to you today.  *If you need a refill on your cardiac medications before your next appointment, please call your pharmacy*   Lab Work: Your physician recommends that you have lab work today- BMET Your physician recommends that you return for lab work in: 2 week for [employer].  If you have labs (blood work) drawn today and your tests are completely normal, you will receive your results only by: MyChart Message (if you have MyChart) OR A paper copy in the mail If you have any lab test that is abnormal or we need to change your treatment, we will call you to review the results.  Testing/Procedures: Your physician has requested that you have an echocardiogram in one year. Echocardiography is a painless test that uses sound waves to create images of your heart. It provides your doctor with information about the size and shape of your heart and how well your heart's chambers and valves are working. This procedure takes approximately one hour. There are no restrictions for this procedure.  Follow-Up: At [facility], you and your health needs are our priority.  As part of our continuing mission to provide you with exceptional heart care, we have created designated Provider Care Teams.  These Care Teams include your primary Cardiologist (physician) and Advanced Practice Providers (APPs -  Physician Assistants and Nurse Practitioners) who all work together to provide you with the care you need, when you need it.  We recommend signing up for the patient portal called "MyChart".  Sign up information is provided on this After Visit Summary.  MyChart is used to connect with patients for Virtual Visits (Telemedicine).  Patients are able to view lab/test results, encounter notes, upcoming appointments, etc.  Non-urgent messages can be sent to your provider  as well.   To learn more about what you can do with MyChart, go to [email or web address].    Your next appointment:   As scheduled  The format for your next appointment:   In Person  Provider:   Carlean Jews, PA-C

## 2020-09-16 NOTE — Progress Notes (Addendum)
[facility]   MULTIDISCIPLINARY HEART VALVE CLINIC                                     Cardiology Office Note:    Date:  09/24/2020   ID:  Stephanie Rosario, DOB Nov 11, 1940, MRN [id number]  PCP:  Marylen Ponto, MD  [facility] Cardiologist:  Norman Herrlich, MD / Dr. Clifton James & Dr. Laneta Simmers (TAVR) [facility] Electrophysiologist:  None   Referring MD: Marylen Ponto, MD   CC: 1 month s/p TAVR  History of Present Illness:    Stephanie Rosario is a 80 y.o. female with a hx of coarctation of the aorta repaired at age 62, hypertension, hyperlipidemia, carotid artery disease, sleep apnea and critical AS s/p TAVR (08/21/20) who presents to clinic for follow up.   Patient states that she underwent uncomplicated repair of coarctation of the aorta at age 24 by Dr. Ria Bush at [facility].  She has been remarkably healthy and relatively physically active all of her adult life until recently.  Over the past 2 to 3 years she has developed progressive symptoms of exertional shortness of breath and fatigue.  Since this past Thanksgiving her symptoms have gotten much more severe such that she gets short of breath with very low level activity.  In addition she has had several episodes of near syncope that are always brought on with physical exertion. She had some chest tightness and chest pressure with exertion.   Patient was recently referred for cardiology consultation by her primary care physician and was initially evaluated by Dr. Dulce Sellar on Jul 24, 2020.  She was noted to have a prominent systolic murmur on physical exam and transthoracic echocardiogram revealed critical aortic stenosis with normal left ventricular systolic function. She was promptly referred to the multidisciplinary heart valve clinic and underwent diagnostic cardiac catheterization by Dr. Clifton James.  Catheterization revealed nonobstructive coronary artery disease and confirmed the presence of severe aortic  stenosis.    She was evaluated by the multidisciplinary valve team and underwent successful TAVR with a 23 mm Edwards S3U via the TF approach on 08/21/20. Post op echo showed EF 70%, normally functioning TAVR with a mean gradient of 10 mmHg and no PVL. She was discharged on aspirin and plavix.    At her follow up apt she was suffering from an acute gout flare in her ankle. She was started on prednisone and HCTZ discontinued. She was later seen by Dr. Servando Salina and her ramipril 10mg  daily was changed to valsartan 40mg  daily.   Today the patient presents to clinic for follow up. Here with husband. Doing great. Gout has resolved. No CP or SOB. No LE edema, orthopnea or PND. No dizziness or syncope. No blood in stool or urine. No palpitations. Is upset her BP is elevated still. Worries about going blind and ophthalmologist wants BP under better control.   Past Medical History:  Diagnosis Date   Aortic stenosis    Benign essential hypertension    Benign paroxysmal positional vertigo    History of nephrolithiasis    Nuclear sclerotic cataract of both eyes 12/30/2013   Primary open angle glaucoma of both eyes, severe stage 12/30/2013   Pure hypercholesterolemia    Severe aortic stenosis    Shortness of breath 07/24/2020   Sleep apnea    CPAP    Past Surgical History:  Procedure Laterality Date  BREAST BIOPSY     Benign   COARCTATION OF AORTA REPAIR     Age 17   INTRAOPERATIVE TRANSTHORACIC ECHOCARDIOGRAM N/A 08/21/2020   Procedure: INTRAOPERATIVE TRANSTHORACIC ECHOCARDIOGRAM;  Surgeon: Kathleene Hazel, MD;  Location: [facility] OR;  Service: Open Heart Surgery;  Laterality: N/A;   RIGHT/LEFT HEART CATH AND CORONARY ANGIOGRAPHY N/A 08/05/2020   Procedure: RIGHT/LEFT HEART CATH AND CORONARY ANGIOGRAPHY;  Surgeon: Kathleene Hazel, MD;  Location: [facility] INVASIVE CV LAB;  Service: Cardiovascular;  Laterality: N/A;   TRANSCATHETER AORTIC VALVE REPLACEMENT, TRANSFEMORAL N/A 08/21/2020   Procedure:  TRANSCATHETER AORTIC VALVE REPLACEMENT, TRANSFEMORAL;  Surgeon: Kathleene Hazel, MD;  Location: [facility] OR;  Service: Open Heart Surgery;  Laterality: N/A;    Current Medications: Current Meds  Medication Sig   acetaminophen (TYLENOL) 325 MG tablet Take 2 tablets (650 mg total) by mouth every 6 (six) hours as needed for mild pain (or Fever >/= 101).   amoxicillin (AMOXIL) 500 MG tablet Take 4 tablets (2000 mg) by mouth ONE HOUR before any dental procedures   aspirin 81 MG chewable tablet Chew 1 tablet (81 mg total) by mouth daily.   bisoprolol-hydrochlorothiazide (ZIAC) 10-6.25 MG tablet Take 0.5 tablets by mouth daily.   brimonidine (ALPHAGAN) 0.2 % ophthalmic solution Place 1 drop into both eyes 3 (three) times daily.   carboxymethylcellulose (REFRESH PLUS) 0.5 % SOLN Place 1 drop into both eyes daily.   clopidogrel (PLAVIX) 75 MG tablet Take 1 tablet (75 mg total) by mouth daily with breakfast.   dorzolamide (TRUSOPT) 2 % ophthalmic solution Place 1 drop into both eyes 3 (three) times daily.   fluticasone (FLONASE) 50 MCG/ACT nasal spray Place 2 sprays into both nostrils as needed for congestion.   ibuprofen (ADVIL) 200 MG tablet Take 200 mg by mouth every 8 (eight) hours as needed for headache or moderate pain.   Latanoprostene Bunod (VYZULTA) 0.024 % SOLN Place 1 drop into both eyes at bedtime.   Netarsudil Dimesylate (RHOPRESSA) 0.02 % SOLN Place 1 drop into both eyes at bedtime.   [DISCONTINUED] valsartan (DIOVAN) 40 MG tablet Take 1 tablet (40 mg total) by mouth daily.     Allergies:   Sulfa antibiotics   Social History   Socioeconomic History   Marital status: Unknown    Spouse name: Not on file   Number of children: Not on file   Years of education: Not on file   Highest education level: Not on file  Occupational History   Not on file  Tobacco Use   Smoking status: Never   Smokeless tobacco: Never  Vaping Use   Vaping Use: Never used  Substance and Sexual Activity    Alcohol use: Yes    Alcohol/week: 1.0 standard drink    Types: 1 Glasses of wine per week    Comment: Rare   Drug use: Never   Sexual activity: Not on file  Other Topics Concern   Not on file  Social History Narrative   Not on file   Social Determinants of Health   Financial Resource Strain: Not on file  Food Insecurity: Not on file  Transportation Needs: Not on file  Physical Activity: Not on file  Stress: Not on file  Social Connections: Not on file     Family History: The patient's family history includes Congestive Heart Failure in her father; Heart attack in her mother; Hypertension in her sister.  ROS:   Please see the history of present illness.    All  other systems reviewed and are negative.  EKGs/Labs/Other Studies Reviewed:    The following studies were reviewed today:  TAVR OPERATIVE NOTE     Date of Procedure:                08/21/2020   Preoperative Diagnosis:      Severe Aortic Stenosis   Postoperative Diagnosis:    Same   Procedure:        Transcatheter Aortic Valve Replacement - Percutaneous Right Transfemoral Approach             Edwards Sapien 3 Ultra THV (size 23 mm, model # 9750TFX, serial # [id number])              Co-Surgeons:                        Alleen Borne, MD and Verne Carrow, MD     Anesthesiologist:                  Lavenia Atlas   Echocardiographer:              Elspeth Cho   Pre-operative Echo Findings: Severe aortic stenosis Normal left ventricular systolic function   Post-operative Echo Findings: No paravalvular leak Normal left ventricular systolic function   _______________________   Echo 08/22/20 IMPRESSIONS   1. 23 mm S3 in the aortic position. V max 2.4 m/s, MG 10 mmHG, EOA 1.41  cm2, DI 0.37. No paravalvular leak. Normal prosthesis. The aortic valve  has been repaired/replaced. Aortic valve regurgitation is not visualized.  Procedure Date: 08/21/2020.   2. Hyperdynamic LV function, with intracavitary  gradient ~13 mmHG. Left  ventricular ejection fraction, by estimation, is 70 to 75%. The left  ventricle has hyperdynamic function. The left ventricle has no regional  wall motion abnormalities. There is mild   concentric left ventricular hypertrophy. Left ventricular diastolic  parameters are consistent with Grade I diastolic dysfunction (impaired  relaxation).   3. Right ventricular systolic function is normal. The right ventricular  size is normal. Tricuspid regurgitation signal is inadequate for assessing  PA pressure.   4. The mitral valve is grossly normal. No evidence of mitral valve  regurgitation. No evidence of mitral stenosis.   ___________________________   Echo 09/16/20 IMPRESSIONS   1. Left ventricular ejection fraction, by estimation, is 70 to 75%. The  left ventricle has hyperdynamic function. The left ventricle has no  regional wall motion abnormalities. There is mild concentric left  ventricular hypertrophy. Left ventricular  diastolic parameters are consistent with Grade I diastolic dysfunction  (impaired relaxation). Elevated left ventricular end-diastolic pressure.   2. Right ventricular systolic function is normal. The right ventricular  size is normal.   3. The mitral valve is normal in structure. No evidence of mitral valve  regurgitation. No evidence of mitral stenosis.   4. The aortic valve has been repaired/replaced. Aortic valve  regurgitation is not visualized. There is a 23 mm Sapien prosthetic (TAVR)  valve present in the aortic position. Procedure Date: 08/21/20. Echo  findings are consistent with normal structure and   function of the aortic valve prosthesis. Aortic valve area, by VTI  measures 1.33 cm. Aortic valve mean gradient measures 15.5 mmHg. Aortic  valve Vmax measures 2.70 m/s.   5. Aortic dilatation noted. There is mild dilatation of the ascending  aorta, measuring 38 mm.   Comparison(s): Prior images reviewed side by side. Changes from  prior  study are  noted. Aortic valve mean gradient increased slightly from 10 to  15 mmHg, but AVA is similar.   Conclusion(s)/Recommendation(s): Otherwise normal echocardiogram, with  minor abnormalities described in the report.    EKG:  EKG is NOT ordered today.  Recent Labs: 08/21/2020: ALT 15 08/22/2020: Hemoglobin 9.8; Magnesium 1.9; Platelets 140 09/16/2020: BUN 34; Creatinine, Ser 1.08; Potassium 4.1; Sodium 143  Recent Lipid Panel No results found for: CHOL, TRIG, HDL, CHOLHDL, VLDL, LDLCALC, LDLDIRECT   Risk Assessment/Calculations:       Physical Exam:    VS:  BP (!) 150/68   Pulse 87   Ht 5\' 2"  (1.575 m)   Wt 147 lb (66.7 kg)   SpO2 98%   BMI 26.89 kg/m     Wt Readings from Last 3 Encounters:  09/16/20 147 lb (66.7 kg)  09/10/20 146 lb (66.2 kg)  09/03/20 146 lb 12.8 oz (66.6 kg)     GEN:  Well nourished, well developed in no acute distress HEENT: Normal NECK: No JVD LYMPHATICS: No lymphadenopathy CARDIAC: RRR, soft flow murmurs. No rubs, gallops RESPIRATORY:  Clear to auscultation without rales, wheezing or rhonchi  ABDOMEN: Soft, non-tender, non-distended MUSCULOSKELETAL:  No edema; No deformity  SKIN: Warm and dry.  NEUROLOGIC:  Alert and oriented x 3 PSYCHIATRIC:  Normal affect   ASSESSMENT:    1. S/P TAVR (transcatheter aortic valve replacement)   2. Benign essential hypertension   3. Acute drug-induced gout of left ankle   4. Thoracic aortic aneurysm without rupture (HCC)   5. Carotid artery disease, unspecified laterality (HCC)     PLAN:    In order of problems listed above:  Severe AS s/p TAVR:  echo today shows EF 70%, normally functioning TAVR with a mean gradient of 15.5 mm hg and no PVL. She has NYHA class I symptoms. Continue on aspirin and plavix. She can discontinue plavix after 6 months (01/2021). SBE prophylaxis discussed; she has amoxicillin. I will see her back in 1 year with an echo.   HTN: BP 150/68.  Currently on 1/2 tabs  of Ziac 10-6.25mg  daily and valsartan 40mg  daily (recently switched from ramipril). Also I discontinued HCTZ tramterene a couple weeks ago. Given change in meds, will check a BMET to today. Plan to increase valsartan to 80mg  daily. BMET in 2 weeks.   Acute gout flare: resolved after a steroid taper. We stopped HCTZ with the hopes that this would decrease gout flares  Ectasia of ascending thoracic aorta: (4.0 cm in diameter).This will be followed over time.   Carotid artery disease: 40-59% RICA stenosis. Continue medical therapy.   Medication Adjustments/Labs and Tests Ordered: Current medicines are reviewed at length with the patient today.  Concerns regarding medicines are outlined above.  Orders Placed This Encounter  Procedures   Basic Metabolic Panel (BMET)   Basic Metabolic Panel (BMET)   ECHOCARDIOGRAM COMPLETE   No orders of the defined types were placed in this encounter.    Patient Instructions  Medication Instructions:  Your physician recommends that you continue on your current medications as directed. Please refer to the Current Medication list given to you today.  *If you need a refill on your cardiac medications before your next appointment, please call your pharmacy*   Lab Work: Your physician recommends that you have lab work today- BMET Your physician recommends that you return for lab work in: 2 week for [employer].  If you have labs (blood work) drawn today and your tests are completely normal, you  will receive your results only by: MyChart Message (if you have MyChart) OR A paper copy in the mail If you have any lab test that is abnormal or we need to change your treatment, we will call you to review the results.  Testing/Procedures: Your physician has requested that you have an echocardiogram in one year. Echocardiography is a painless test that uses sound waves to create images of your heart. It provides your doctor with information about the size and shape of  your heart and how well your heart's chambers and valves are working. This procedure takes approximately one hour. There are no restrictions for this procedure.  Follow-Up: At [facility], you and your health needs are our priority.  As part of our continuing mission to provide you with exceptional heart care, we have created designated Provider Care Teams.  These Care Teams include your primary Cardiologist (physician) and Advanced Practice Providers (APPs -  Physician Assistants and Nurse Practitioners) who all work together to provide you with the care you need, when you need it.  We recommend signing up for the patient portal called "MyChart".  Sign up information is provided on this After Visit Summary.  MyChart is used to connect with patients for Virtual Visits (Telemedicine).  Patients are able to view lab/test results, encounter notes, upcoming appointments, etc.  Non-urgent messages can be sent to your provider as well.   To learn more about what you can do with MyChart, go to [email or web address].    Your next appointment:   As scheduled  The format for your next appointment:   In Person  Provider:   Carlean Jews, PA-C   Signed, Cline Crock, PA-C  09/24/2020 3:14 PM    [facility]

## 2020-09-17 LAB — BASIC METABOLIC PANEL
BUN/Creatinine Ratio: 31 — ABNORMAL HIGH (ref 12–28)
BUN: 34 mg/dL — ABNORMAL HIGH (ref 8–27)
CO2: 17 mmol/L — ABNORMAL LOW (ref 20–29)
Calcium: 10.3 mg/dL (ref 8.7–10.3)
Chloride: 108 mmol/L — ABNORMAL HIGH (ref 96–106)
Creatinine, Ser: 1.08 mg/dL — ABNORMAL HIGH (ref 0.57–1.00)
Glucose: 117 mg/dL — ABNORMAL HIGH (ref 65–99)
Potassium: 4.1 mmol/L (ref 3.5–5.2)
Sodium: 143 mmol/L (ref 134–144)
eGFR: 52 mL/min/{1.73_m2} — ABNORMAL LOW (ref >59)

## 2020-09-22 ENCOUNTER — Other Ambulatory Visit: Payer: Self-pay | Admitting: Physician Assistant

## 2020-09-22 ENCOUNTER — Telehealth: Payer: Self-pay | Admitting: Physician Assistant

## 2020-09-22 MED ORDER — VALSARTAN 40 MG PO TABS: 60.0000 mg | ORAL_TABLET | Freq: Every day | ORAL | 3 refills | Status: DC

## 2020-09-22 NOTE — Telephone Encounter (Signed)
  HEART AND VASCULAR CENTER   MULTIDISCIPLINARY HEART VALVE TEAM  She was seen last week and BP noted to be elevated. We increased valsartan from 40mg  to 80 mg daily. Patient's BP was doing great on increased dose but then developed some dizziness associated with BPs in low 100s. We will plan to cut back to 1.5 tabs (60mg  daily).  PA-C  [facility]

## 2020-10-02 DIAGNOSIS — I10 Essential (primary) hypertension: Secondary | ICD-10-CM | POA: Diagnosis not present

## 2020-10-02 DIAGNOSIS — M10272 Drug-induced gout, left ankle and foot: Secondary | ICD-10-CM | POA: Diagnosis not present

## 2020-10-02 DIAGNOSIS — Z952 Presence of prosthetic heart valve: Secondary | ICD-10-CM | POA: Diagnosis not present

## 2020-10-02 LAB — BASIC METABOLIC PANEL
BUN/Creatinine Ratio: 22 (ref 12–28)
BUN: 24 mg/dL (ref 8–27)
CO2: 20 mmol/L (ref 20–29)
Calcium: 9.8 mg/dL (ref 8.7–10.3)
Chloride: 106 mmol/L (ref 96–106)
Creatinine, Ser: 1.1 mg/dL — ABNORMAL HIGH (ref 0.57–1.00)
Glucose: 134 mg/dL — ABNORMAL HIGH (ref 65–99)
Potassium: 4.3 mmol/L (ref 3.5–5.2)
Sodium: 143 mmol/L (ref 134–144)
eGFR: 51 mL/min/{1.73_m2} — ABNORMAL LOW (ref >59)

## 2020-10-05 ENCOUNTER — Other Ambulatory Visit: Payer: Self-pay | Admitting: *Deleted

## 2020-10-05 DIAGNOSIS — I10 Essential (primary) hypertension: Secondary | ICD-10-CM

## 2020-10-05 MED ORDER — VALSARTAN 40 MG PO TABS: 80.0000 mg | ORAL_TABLET | Freq: Every day | ORAL | 3 refills | Status: DC

## 2020-10-06 DIAGNOSIS — I350 Nonrheumatic aortic (valve) stenosis: Secondary | ICD-10-CM | POA: Diagnosis not present

## 2020-10-06 DIAGNOSIS — Z952 Presence of prosthetic heart valve: Secondary | ICD-10-CM | POA: Diagnosis not present

## 2020-10-06 DIAGNOSIS — I2510 Atherosclerotic heart disease of native coronary artery without angina pectoris: Secondary | ICD-10-CM | POA: Diagnosis not present

## 2020-10-12 DIAGNOSIS — I2510 Atherosclerotic heart disease of native coronary artery without angina pectoris: Secondary | ICD-10-CM | POA: Diagnosis not present

## 2020-10-12 DIAGNOSIS — I350 Nonrheumatic aortic (valve) stenosis: Secondary | ICD-10-CM | POA: Diagnosis not present

## 2020-10-12 DIAGNOSIS — Z952 Presence of prosthetic heart valve: Secondary | ICD-10-CM | POA: Diagnosis not present

## 2020-10-14 DIAGNOSIS — I2510 Atherosclerotic heart disease of native coronary artery without angina pectoris: Secondary | ICD-10-CM | POA: Diagnosis not present

## 2020-10-14 DIAGNOSIS — I350 Nonrheumatic aortic (valve) stenosis: Secondary | ICD-10-CM | POA: Diagnosis not present

## 2020-10-14 DIAGNOSIS — Z952 Presence of prosthetic heart valve: Secondary | ICD-10-CM | POA: Diagnosis not present

## 2020-10-16 DIAGNOSIS — I350 Nonrheumatic aortic (valve) stenosis: Secondary | ICD-10-CM | POA: Diagnosis not present

## 2020-10-16 DIAGNOSIS — I2510 Atherosclerotic heart disease of native coronary artery without angina pectoris: Secondary | ICD-10-CM | POA: Diagnosis not present

## 2020-10-16 DIAGNOSIS — Z952 Presence of prosthetic heart valve: Secondary | ICD-10-CM | POA: Diagnosis not present

## 2020-10-19 ENCOUNTER — Other Ambulatory Visit: Payer: Self-pay

## 2020-10-19 DIAGNOSIS — I10 Essential (primary) hypertension: Secondary | ICD-10-CM | POA: Diagnosis not present

## 2020-10-20 LAB — BASIC METABOLIC PANEL
BUN/Creatinine Ratio: 27 (ref 12–28)
BUN: 26 mg/dL (ref 8–27)
CO2: 21 mmol/L (ref 20–29)
Calcium: 10.4 mg/dL — ABNORMAL HIGH (ref 8.7–10.3)
Chloride: 105 mmol/L (ref 96–106)
Creatinine, Ser: 0.98 mg/dL (ref 0.57–1.00)
Glucose: 117 mg/dL — ABNORMAL HIGH (ref 65–99)
Potassium: 4.1 mmol/L (ref 3.5–5.2)
Sodium: 142 mmol/L (ref 134–144)
eGFR: 58 mL/min/{1.73_m2} — ABNORMAL LOW (ref >59)

## 2020-10-21 DIAGNOSIS — I2510 Atherosclerotic heart disease of native coronary artery without angina pectoris: Secondary | ICD-10-CM | POA: Diagnosis not present

## 2020-10-21 DIAGNOSIS — I350 Nonrheumatic aortic (valve) stenosis: Secondary | ICD-10-CM | POA: Diagnosis not present

## 2020-10-21 DIAGNOSIS — Z952 Presence of prosthetic heart valve: Secondary | ICD-10-CM | POA: Diagnosis not present

## 2020-10-23 DIAGNOSIS — I2510 Atherosclerotic heart disease of native coronary artery without angina pectoris: Secondary | ICD-10-CM | POA: Diagnosis not present

## 2020-10-23 DIAGNOSIS — I350 Nonrheumatic aortic (valve) stenosis: Secondary | ICD-10-CM | POA: Diagnosis not present

## 2020-10-23 DIAGNOSIS — Z952 Presence of prosthetic heart valve: Secondary | ICD-10-CM | POA: Diagnosis not present

## 2020-11-03 DIAGNOSIS — Z952 Presence of prosthetic heart valve: Secondary | ICD-10-CM | POA: Diagnosis not present

## 2020-11-04 DIAGNOSIS — Z952 Presence of prosthetic heart valve: Secondary | ICD-10-CM | POA: Diagnosis not present

## 2020-11-04 NOTE — Progress Notes (Signed)
Cardiology Office Note:    Date:  11/05/2020   ID:  Stephanie Rosario, DOB 1940/11/05, MRN [id number]  PCP:  Marylen Ponto, MD  Cardiologist:  Norman Herrlich, MD    Referring MD: Marylen Ponto, MD    ASSESSMENT:    1. S/P TAVR (transcatheter aortic valve replacement)   2. Mild CAD   3. History of repair of coarctation of aorta   4. Benign essential hypertension   5. Pure hypercholesterolemia    PLAN:    In order of problems listed above:  Very quick and complete recovery post TAVR no findings of valve dysfunction continue her dual antiplatelet therapy for 6 months and dropped aspirin reinforced endocarditis prophylaxis with her optimize treatment of hypertension hyperlipidemia continue cardiac rehabilitation plan to see her back in the office 3 months recheck lipid profile 6 to 8 weeks   Next appointment: 3 months   Medication Adjustments/Labs and Tests Ordered: Current medicines are reviewed at length with the patient today.  Concerns regarding medicines are outlined above.  No orders of the defined types were placed in this encounter.  No orders of the defined types were placed in this encounter.   Chief Complaint  Patient presents with   Follow-up    After TAVR   Hypertension    History of Present Illness:    Stephanie Rosario is a 80 y.o. female with a hx of congenital heart disease with surgical repair coarctation of the aorta at age 75 hypertension hyperlipidemia carotid artery disease sleep apnea and symptomatic critical aortic stenosis with TAVR 08/21/2020 last seen at structural heart service 09/24/2020 coronary angiography showed mild nonobstructive CAD.She was evaluated by the multidisciplinary valve team and underwent successful TAVR with a 23 mm Edwards S3U via the TF approach on 08/21/20. Post op echo showed EF 70%, normally functioning TAVR with a mean gradient of 10 mmHg and no PVL. She was discharged on aspirin and plavix   Compliance with diet, lifestyle and  medications: Yes  She has quickly recovered.  I reviewed her echo findings with her she has normal exercise tolerance and is hiking this weekend in the mountains. She continues to have foot pain she does not have gout she has arthritis. Her blood pressure is not well controlled she is using a wrist device at home I told her to purchase an arm cuff and then optimize BP treatment by switching to a potent beta-blocker carvedilol dropping a diuretic and splitting her valsartan to twice daily and she can trend blood pressure at home and in cardiac rehabilitation. I strongly encouraged her to except a statin and she is agreeing to take a low-dose of a low intensity statin. Past Medical History:  Diagnosis Date   Aortic stenosis    Benign essential hypertension    Benign paroxysmal positional vertigo    History of nephrolithiasis    Nuclear sclerotic cataract of both eyes 12/30/2013   Primary open angle glaucoma of both eyes, severe stage 12/30/2013   Pure hypercholesterolemia    Severe aortic stenosis    Shortness of breath 07/24/2020   Sleep apnea    CPAP    Past Surgical History:  Procedure Laterality Date   BREAST BIOPSY     Benign   COARCTATION OF AORTA REPAIR     Age 15   INTRAOPERATIVE TRANSTHORACIC ECHOCARDIOGRAM N/A 08/21/2020   Procedure: INTRAOPERATIVE TRANSTHORACIC ECHOCARDIOGRAM;  Surgeon: Kathleene Hazel, MD;  Location: [facility] OR;  Service: Open Heart Surgery;  Laterality: N/A;  RIGHT/LEFT HEART CATH AND CORONARY ANGIOGRAPHY N/A 08/05/2020   Procedure: RIGHT/LEFT HEART CATH AND CORONARY ANGIOGRAPHY;  Surgeon: Kathleene Hazel, MD;  Location: [facility] INVASIVE CV LAB;  Service: Cardiovascular;  Laterality: N/A;   TRANSCATHETER AORTIC VALVE REPLACEMENT, TRANSFEMORAL N/A 08/21/2020   Procedure: TRANSCATHETER AORTIC VALVE REPLACEMENT, TRANSFEMORAL;  Surgeon: Kathleene Hazel, MD;  Location: [facility] OR;  Service: Open Heart Surgery;  Laterality: N/A;    Current  Medications: Current Meds  Medication Sig   acetaminophen (TYLENOL) 325 MG tablet Take 2 tablets (650 mg total) by mouth every 6 (six) hours as needed for mild pain (or Fever >/= 101).   amoxicillin (AMOXIL) 500 MG tablet Take 4 tablets (2000 mg) by mouth ONE HOUR before any dental procedures   aspirin 81 MG chewable tablet Chew 1 tablet (81 mg total) by mouth daily.   bisoprolol-hydrochlorothiazide (ZIAC) 10-6.25 MG tablet Take 0.5 tablets by mouth daily.   brimonidine (ALPHAGAN) 0.2 % ophthalmic solution Place 1 drop into both eyes 3 (three) times daily.   carboxymethylcellulose (REFRESH PLUS) 0.5 % SOLN Place 1 drop into both eyes daily.   clopidogrel (PLAVIX) 75 MG tablet Take 1 tablet (75 mg total) by mouth daily with breakfast.   dorzolamide (TRUSOPT) 2 % ophthalmic solution Place 1 drop into both eyes 3 (three) times daily.   fluticasone (FLONASE) 50 MCG/ACT nasal spray Place 2 sprays into both nostrils as needed for congestion.   ibuprofen (ADVIL) 200 MG tablet Take 200 mg by mouth every 8 (eight) hours as needed for headache or moderate pain.   Latanoprostene Bunod (VYZULTA) 0.024 % SOLN Place 1 drop into both eyes at bedtime.   Netarsudil Dimesylate (RHOPRESSA) 0.02 % SOLN Place 1 drop into both eyes at bedtime.   Polyvinyl Alcohol-Povidone (REFRESH OP) Apply 1 drop to eye daily.   valsartan (DIOVAN) 40 MG tablet Take 2 tablets (80 mg total) by mouth daily.     Allergies:   Sulfa antibiotics   Social History   Socioeconomic History   Marital status: Unknown    Spouse name: Not on file   Number of children: Not on file   Years of education: Not on file   Highest education level: Not on file  Occupational History   Not on file  Tobacco Use   Smoking status: Never   Smokeless tobacco: Never  Vaping Use   Vaping Use: Never used  Substance and Sexual Activity   Alcohol use: Yes    Alcohol/week: 1.0 standard drink    Types: 1 Glasses of wine per week    Comment: Rare    Drug use: Never   Sexual activity: Not on file  Other Topics Concern   Not on file  Social History Narrative   Not on file   Social Determinants of Health   Financial Resource Strain: Not on file  Food Insecurity: Not on file  Transportation Needs: Not on file  Physical Activity: Not on file  Stress: Not on file  Social Connections: Not on file     Family History: The patient's family history includes Congestive Heart Failure in her father; Heart attack in her mother; Hypertension in her sister. ROS:   Please see the history of present illness.    All other systems reviewed and are negative.  EKGs/Labs/Other Studies Reviewed:    The following studies were reviewed today:    Recent Labs: 08/21/2020: ALT 15 08/22/2020: Hemoglobin 9.8; Magnesium 1.9; Platelets 140 10/19/2020: BUN 26; Creatinine, Ser 0.98; Potassium 4.1;  Sodium 142  Recent Lipid Panel 04/23/2020 cholesterol 181 LDL 99 triglycerides 189 HDL 44   Physical Exam:    VS:  BP 130/64 (BP Location: Right Arm, Patient Position: Sitting, Cuff Size: Normal)   Pulse 72   Ht 5\' 2"  (1.575 m)   Wt 149 lb (67.6 kg)   SpO2 96%   BMI 27.25 kg/m     Wt Readings from Last 3 Encounters:  11/05/20 149 lb (67.6 kg)  09/16/20 147 lb (66.7 kg)  09/10/20 146 lb (66.2 kg)     GEN:  Well nourished, well developed in no acute distress HEENT: Normal NECK: No JVD; No carotid bruits LYMPHATICS: No lymphadenopathy CARDIAC: RRR, no murmurs, rubs, gallops RESPIRATORY:  Clear to auscultation without rales, wheezing or rhonchi  ABDOMEN: Soft, non-tender, non-distended MUSCULOSKELETAL:  No edema; No deformity  SKIN: Warm and dry NEUROLOGIC:  Alert and oriented x 3 PSYCHIATRIC:  Normal affect    Signed, 09/12/20, MD  11/05/2020 3:04 PM    [facility]

## 2020-11-05 ENCOUNTER — Ambulatory Visit (INDEPENDENT_AMBULATORY_CARE_PROVIDER_SITE_OTHER): Payer: Medicare Other | Admitting: Cardiology

## 2020-11-05 ENCOUNTER — Other Ambulatory Visit: Payer: Self-pay

## 2020-11-05 ENCOUNTER — Encounter: Payer: Self-pay | Admitting: Cardiology

## 2020-11-05 ENCOUNTER — Other Ambulatory Visit: Payer: Self-pay | Admitting: Physician Assistant

## 2020-11-05 VITALS — BP 130/64 | HR 72 | Ht 62.0 in | Wt 149.0 lb

## 2020-11-05 DIAGNOSIS — I10 Essential (primary) hypertension: Secondary | ICD-10-CM | POA: Diagnosis not present

## 2020-11-05 DIAGNOSIS — I2510 Atherosclerotic heart disease of native coronary artery without angina pectoris: Secondary | ICD-10-CM

## 2020-11-05 DIAGNOSIS — E7800 Pure hypercholesterolemia, unspecified: Secondary | ICD-10-CM

## 2020-11-05 DIAGNOSIS — Z8774 Personal history of (corrected) congenital malformations of heart and circulatory system: Secondary | ICD-10-CM

## 2020-11-05 DIAGNOSIS — Z952 Presence of prosthetic heart valve: Secondary | ICD-10-CM | POA: Diagnosis not present

## 2020-11-05 MED ORDER — PRAVASTATIN SODIUM 20 MG PO TABS: 20.0000 mg | ORAL_TABLET | Freq: Every evening | ORAL | 3 refills | Status: DC

## 2020-11-05 MED ORDER — CLOPIDOGREL BISULFATE 75 MG PO TABS: 75.0000 mg | ORAL_TABLET | Freq: Every day | ORAL | 0 refills | Status: DC

## 2020-11-05 MED ORDER — CARVEDILOL 6.25 MG PO TABS
6.2500 mg | ORAL_TABLET | Freq: Two times a day (BID) | ORAL | 3 refills | Status: DC
Start: 2020-11-05 — End: 2021-04-30

## 2020-11-05 MED ORDER — AMOXICILLIN 500 MG PO TABS: ORAL_TABLET | ORAL | 2 refills | Status: AC

## 2020-11-05 MED ORDER — VALSARTAN 40 MG PO TABS
40.0000 mg | ORAL_TABLET | Freq: Two times a day (BID) | ORAL | 3 refills | Status: DC
Start: 2020-11-05 — End: 2021-01-05

## 2020-11-05 NOTE — Patient Instructions (Addendum)
Medication Instructions:  Your physician has recommended you make the following change in your medication:  START: Carvedilol 6.25 mg take one tablet by mouth twice daily. START: Pravastatin 20 mg take one tablet by mouth daily.  DECREASE: Valsartan 40 mg take one tablet by mouth twice daily.  Please discontinue your plavix on 02/20/2021 and begin taking aspirin 81 mg daily.  *If you need a refill on your cardiac medications before your next appointment, please call your pharmacy*   Lab Work: Your physician recommends that you return for lab work in: 6 weeks Lipids If you have labs (blood work) drawn today and your tests are completely normal, you will receive your results only by: MyChart Message (if you have MyChart) OR A paper copy in the mail If you have any lab test that is abnormal or we need to change your treatment, we will call you to review the results.   Testing/Procedures: None   Follow-Up: At [facility], you and your health needs are our priority.  As part of our continuing mission to provide you with exceptional heart care, we have created designated Provider Care Teams.  These Care Teams include your primary Cardiologist (physician) and Advanced Practice Providers (APPs -  Physician Assistants and Nurse Practitioners) who all work together to provide you with the care you need, when you need it.  We recommend signing up for the patient portal called "MyChart".  Sign up information is provided on this After Visit Summary.  MyChart is used to connect with patients for Virtual Visits (Telemedicine).  Patients are able to view lab/test results, encounter notes, upcoming appointments, etc.  Non-urgent messages can be sent to your provider as well.   To learn more about what you can do with MyChart, go to [email or web address].    Your next appointment:   8 week(s)  The format for your next appointment:   In Person  Provider:   Norman Herrlich, MD   Other  Instructions

## 2020-11-06 DIAGNOSIS — Z952 Presence of prosthetic heart valve: Secondary | ICD-10-CM | POA: Diagnosis not present

## 2020-11-09 DIAGNOSIS — Z952 Presence of prosthetic heart valve: Secondary | ICD-10-CM | POA: Diagnosis not present

## 2020-11-11 DIAGNOSIS — Z952 Presence of prosthetic heart valve: Secondary | ICD-10-CM | POA: Diagnosis not present

## 2020-11-16 DIAGNOSIS — Z952 Presence of prosthetic heart valve: Secondary | ICD-10-CM | POA: Diagnosis not present

## 2020-11-21 DIAGNOSIS — Z23 Encounter for immunization: Secondary | ICD-10-CM | POA: Diagnosis not present

## 2020-11-30 DIAGNOSIS — Z952 Presence of prosthetic heart valve: Secondary | ICD-10-CM | POA: Diagnosis not present

## 2020-12-02 DIAGNOSIS — Z952 Presence of prosthetic heart valve: Secondary | ICD-10-CM | POA: Diagnosis not present

## 2020-12-04 DIAGNOSIS — Z952 Presence of prosthetic heart valve: Secondary | ICD-10-CM | POA: Diagnosis not present

## 2020-12-07 DIAGNOSIS — Z952 Presence of prosthetic heart valve: Secondary | ICD-10-CM | POA: Diagnosis not present

## 2020-12-09 DIAGNOSIS — Z952 Presence of prosthetic heart valve: Secondary | ICD-10-CM | POA: Diagnosis not present

## 2020-12-11 DIAGNOSIS — Z952 Presence of prosthetic heart valve: Secondary | ICD-10-CM | POA: Diagnosis not present

## 2020-12-14 DIAGNOSIS — Z952 Presence of prosthetic heart valve: Secondary | ICD-10-CM | POA: Diagnosis not present

## 2020-12-14 DIAGNOSIS — R7303 Prediabetes: Secondary | ICD-10-CM

## 2020-12-14 DIAGNOSIS — E7800 Pure hypercholesterolemia, unspecified: Secondary | ICD-10-CM

## 2020-12-16 DIAGNOSIS — Z952 Presence of prosthetic heart valve: Secondary | ICD-10-CM | POA: Diagnosis not present

## 2020-12-17 ENCOUNTER — Other Ambulatory Visit: Payer: Self-pay

## 2020-12-17 DIAGNOSIS — R7303 Prediabetes: Secondary | ICD-10-CM | POA: Diagnosis not present

## 2020-12-17 DIAGNOSIS — E7800 Pure hypercholesterolemia, unspecified: Secondary | ICD-10-CM | POA: Diagnosis not present

## 2020-12-18 DIAGNOSIS — Z952 Presence of prosthetic heart valve: Secondary | ICD-10-CM | POA: Diagnosis not present

## 2020-12-18 LAB — LIPID PANEL
Chol/HDL Ratio: 3.2 ratio (ref 0.0–4.4)
Cholesterol, Total: 164 mg/dL (ref 100–199)
HDL: 51 mg/dL (ref >39)
LDL Chol Calc (NIH): 97 mg/dL (ref 0–99)
Triglycerides: 87 mg/dL (ref 0–149)
VLDL Cholesterol Cal: 16 mg/dL (ref 5–40)

## 2020-12-18 LAB — HEMOGLOBIN A1C
Est. average glucose Bld gHb Est-mCnc: 123 mg/dL
Hgb A1c MFr Bld: 5.9 % — ABNORMAL HIGH (ref 4.8–5.6)

## 2020-12-21 ENCOUNTER — Telehealth: Payer: Self-pay

## 2020-12-21 DIAGNOSIS — Z952 Presence of prosthetic heart valve: Secondary | ICD-10-CM | POA: Diagnosis not present

## 2020-12-21 NOTE — Telephone Encounter (Signed)
Patient returning call.

## 2020-12-21 NOTE — Telephone Encounter (Signed)
-----   Message from Baldo Daub, MD sent at 12/21/2020  7:54 AM EDT ----- Peri [place] results lipids are at target continue her statin her A1c is very mildly elevated and looks unchanged from 4 months before.  Discussions and treatment prediabetes I would direct her to her primary care physician.

## 2020-12-21 NOTE — Telephone Encounter (Signed)
Spoke with patient regarding results and recommendation.  Patient verbalizes understanding and is agreeable to plan of care. Advised patient to call back with any issues or concerns.  

## 2020-12-21 NOTE — Telephone Encounter (Signed)
-----   Message from Brian J Munley, MD sent at 12/21/2020  7:54 AM EDT ----- Good results lipids are at target continue her statin her A1c is very mildly elevated and looks unchanged from 4 months before.  Discussions and treatment prediabetes I would direct her to her primary care physician. 

## 2020-12-21 NOTE — Telephone Encounter (Signed)
Left message on patients voicemail to please return our call.   

## 2020-12-22 ENCOUNTER — Other Ambulatory Visit: Payer: Self-pay | Admitting: Physician Assistant

## 2020-12-22 ENCOUNTER — Other Ambulatory Visit: Payer: Self-pay | Admitting: Cardiology

## 2020-12-23 DIAGNOSIS — Z952 Presence of prosthetic heart valve: Secondary | ICD-10-CM | POA: Diagnosis not present

## 2020-12-25 DIAGNOSIS — Z952 Presence of prosthetic heart valve: Secondary | ICD-10-CM | POA: Diagnosis not present

## 2020-12-25 DIAGNOSIS — Z6826 Body mass index (BMI) 26.0-26.9, adult: Secondary | ICD-10-CM | POA: Diagnosis not present

## 2020-12-25 DIAGNOSIS — R7303 Prediabetes: Secondary | ICD-10-CM | POA: Diagnosis not present

## 2020-12-28 DIAGNOSIS — Z952 Presence of prosthetic heart valve: Secondary | ICD-10-CM | POA: Diagnosis not present

## 2020-12-30 DIAGNOSIS — Z952 Presence of prosthetic heart valve: Secondary | ICD-10-CM | POA: Diagnosis not present

## 2021-01-01 DIAGNOSIS — Z952 Presence of prosthetic heart valve: Secondary | ICD-10-CM | POA: Diagnosis not present

## 2021-01-04 DIAGNOSIS — Z952 Presence of prosthetic heart valve: Secondary | ICD-10-CM | POA: Diagnosis not present

## 2021-01-05 ENCOUNTER — Other Ambulatory Visit: Payer: Self-pay

## 2021-01-05 ENCOUNTER — Encounter: Payer: Self-pay | Admitting: Cardiology

## 2021-01-05 ENCOUNTER — Ambulatory Visit (INDEPENDENT_AMBULATORY_CARE_PROVIDER_SITE_OTHER): Payer: Medicare Other | Admitting: Cardiology

## 2021-01-05 VITALS — BP 180/70 | HR 72 | Ht 62.0 in | Wt 145.0 lb

## 2021-01-05 DIAGNOSIS — Z952 Presence of prosthetic heart valve: Secondary | ICD-10-CM

## 2021-01-05 DIAGNOSIS — I10 Essential (primary) hypertension: Secondary | ICD-10-CM

## 2021-01-05 DIAGNOSIS — R7303 Prediabetes: Secondary | ICD-10-CM | POA: Diagnosis not present

## 2021-01-05 DIAGNOSIS — E7800 Pure hypercholesterolemia, unspecified: Secondary | ICD-10-CM | POA: Diagnosis not present

## 2021-01-05 MED ORDER — CLOPIDOGREL BISULFATE 75 MG PO TABS: 75.0000 mg | ORAL_TABLET | Freq: Every day | ORAL | 0 refills | Status: DC

## 2021-01-05 NOTE — Patient Instructions (Signed)

## 2021-01-05 NOTE — Progress Notes (Signed)
Cardiology Office Note:    Date:  01/05/2021   ID:  Stephanie Rosario, DOB 03-Jan-1941, MRN [id number]  PCP:  Marylen Ponto, MD  Cardiologist:  Norman Herrlich, MD    Referring MD: Marylen Ponto, MD    ASSESSMENT:    1. S/P TAVR (transcatheter aortic valve replacement)   2. Benign essential hypertension   3. Pure hypercholesterolemia   4. Pre-diabetes    PLAN:    In order of problems listed above:  Stephanie Rosario is made a good recovery from her symptomatic critical aortic stenosis after TAVR.  She will transition to single antiplatelet aspirin after 90 days and stop clopidogrel. Home blood pressures at target continue current treatment carvedilol ARB valsartan She has decided not to take a statin 6   Next appointment: 6 months   Medication Adjustments/Labs and Tests Ordered: Current medicines are reviewed at length with the patient today.  Concerns regarding medicines are outlined above.  No orders of the defined types were placed in this encounter.  Meds ordered this encounter  Medications   clopidogrel (PLAVIX) 75 MG tablet    Sig: Take 1 tablet (75 mg total) by mouth daily.    Dispense:  40 tablet    Refill:  0     Chief Complaint  Patient presents with   Follow-up    Sp TAVR     History of Present Illness:    Stephanie Rosario is a 80 y.o. female with a hx of congenital heart disease with surgical repair coarctation of the aorta at age 61 hypertension hyperlipidemia carotid artery disease sleep apnea and symptomatic critical aortic stenosis with TAVR 08/21/2020 last seen at structural heart service 09/24/2020 coronary angiography showed mild nonobstructive CAD.She was evaluated by the multidisciplinary valve team and underwent successful TAVR with a 23 mm Edwards S3U via the TF approach on 08/21/20. Post op echo showed EF 70%, normally functioning TAVR with a mean gradient of 10 mmHg and no PVL. She was discharged on aspirin and plavix  last seen 11/05/2020.  She had a very quick  and complete recovery post TAVR with a plan to drop clopidogrel at 6 months continue cardiac rehabilitation.  Compliance with diet, lifestyle and medications: Yes  She continues to do well in cardiac rehabilitation and she is completely recovered No angina palpitations shortness of breath or syncope. She has very good healthcare literacy trends her blood pressure at home runs consistently 1 20-1 30 systolic and is exaggerated with physicians office. Her recent lipid profile shows an LDL of 97 and she is made a decision not to take a statin with her concern of prediabetes. Past Medical History:  Diagnosis Date   Aortic stenosis    Benign essential hypertension    Benign paroxysmal positional vertigo    History of nephrolithiasis    Nuclear sclerotic cataract of both eyes 12/30/2013   Primary open angle glaucoma of both eyes, severe stage 12/30/2013   Pure hypercholesterolemia    Severe aortic stenosis    Shortness of breath 07/24/2020   Sleep apnea    CPAP    Past Surgical History:  Procedure Laterality Date   BREAST BIOPSY     Benign   COARCTATION OF AORTA REPAIR     Age 88   INTRAOPERATIVE TRANSTHORACIC ECHOCARDIOGRAM N/A 08/21/2020   Procedure: INTRAOPERATIVE TRANSTHORACIC ECHOCARDIOGRAM;  Surgeon: Stephanie Hazel, MD;  Location: [facility] OR;  Service: Open Heart Surgery;  Laterality: N/A;   RIGHT/LEFT HEART CATH AND CORONARY ANGIOGRAPHY N/A 08/05/2020  Procedure: RIGHT/LEFT HEART CATH AND CORONARY ANGIOGRAPHY;  Surgeon: Stephanie Hazel, MD;  Location: [facility] IN[facility];  Service: Cardiovascular;  Laterality: N/A;   TRANSCATHETER AORTIC VALVE REPLACEMENT, TRANSFEMORAL N/A 08/21/2020   Procedure: TRANSCATHETER AORTIC VALVE REPLACEMENT, TRANSFEMORAL;  Surgeon: Stephanie Hazel, MD;  Location: [facility] OR;  Service: Open Heart Surgery;  Laterality: N/A;    Current Medications: Current Meds  Medication Sig   acetaminophen (TYLENOL) 325 MG tablet Take 2 tablets (650 mg  total) by mouth every 6 (six) hours as needed for mild pain (or Fever >/= 101).   amoxicillin (AMOXIL) 500 MG tablet Take 4 tablets (2000 mg) by mouth ONE HOUR before any dental procedures   aspirin 81 MG chewable tablet Chew 1 tablet (81 mg total) by mouth daily.   brimonidine (ALPHAGAN) 0.2 % ophthalmic solution Place 1 drop into both eyes 3 (three) times daily.   carvedilol (COREG) 6.25 MG tablet Take 1 tablet (6.25 mg total) by mouth 2 (two) times daily.   dorzolamide (TRUSOPT) 2 % ophthalmic solution Place 1 drop into both eyes 3 (three) times daily.   fluticasone (FLONASE) 50 MCG/ACT nasal spray Place 2 sprays into both nostrils as needed for congestion.   hydroxypropyl methylcellulose / hypromellose (ISOPTO TEARS / GONIOVISC) 2.5 % ophthalmic solution Place 1 drop into both eyes daily.   ibuprofen (ADVIL) 200 MG tablet Take 200 mg by mouth every 8 (eight) hours as needed for headache or moderate pain.   Latanoprostene Bunod (VYZULTA) 0.024 % SOLN Place 1 drop into both eyes at bedtime.   Netarsudil Dimesylate (RHOPRESSA) 0.02 % SOLN Place 1 drop into both eyes at bedtime.   pravastatin (PRAVACHOL) 20 MG tablet Take 1 tablet (20 mg total) by mouth every evening.   valsartan (DIOVAN) 40 MG tablet Take 1 tablet (40 mg total) by mouth 2 (two) times daily.   [DISCONTINUED] clopidogrel (PLAVIX) 75 MG tablet TAKE 1 TABLET BY MOUTH EVERY DAY     Allergies:   Sulfa antibiotics   Social History   Socioeconomic History   Marital status: Unknown    Spouse name: Not on file   Number of children: Not on file   Years of education: Not on file   Highest education level: Not on file  Occupational History   Not on file  Tobacco Use   Smoking status: Never   Smokeless tobacco: Never  Vaping Use   Vaping Use: Never used  Substance and Sexual Activity   Alcohol use: Yes    Alcohol/week: 1.0 standard drink    Types: 1 Glasses of wine per week    Comment: Rare   Drug use: Never   Sexual  activity: Not on file  Other Topics Concern   Not on file  Social History Narrative   Not on file   Social Determinants of Health   Financial Resource Strain: Not on file  Food Insecurity: Not on file  Transportation Needs: Not on file  Physical Activity: Not on file  Stress: Not on file  Social Connections: Not on file     Family History: The patient's family history includes Congestive Heart Failure in her father; Heart attack in her mother; Hypertension in her sister. ROS:   Please see the history of present illness.    All other systems reviewed and are negative.  EKGs/Labs/Other Studies Reviewed:    The following studies were reviewed today:   Recent Labs: 08/21/2020: ALT 15 08/22/2020: Hemoglobin 9.8; Magnesium 1.9; Platelets 140 10/19/2020: BUN  26; Creatinine, Ser 0.98; Potassium 4.1; Sodium 142  Recent Lipid Panel    Component Value Date/Time   CHOL 164 12/17/2020 0942   TRIG 87 12/17/2020 0942   HDL 51 12/17/2020 0942   CHOLHDL 3.2 12/17/2020 0942   LDLCALC 97 12/17/2020 0942    Physical Exam:    VS:  BP (!) 180/70 (BP Location: Right Arm, Patient Position: Sitting, Cuff Size: Normal)   Pulse 72   Ht 5\' 2"  (1.575 m)   Wt 145 lb (65.8 kg)   SpO2 97%   BMI 26.52 kg/m     Wt Readings from Last 3 Encounters:  01/05/21 145 lb (65.8 kg)  11/05/20 149 lb (67.6 kg)  09/16/20 147 lb (66.7 kg)     GEN:  Well nourished, well developed in no acute distress HEENT: Normal NECK: No JVD; No carotid bruits LYMPHATICS: No lymphadenopathy CARDIAC: RRR, no murmurs, rubs, gallops RESPIRATORY:  Clear to auscultation without rales, wheezing or rhonchi  ABDOMEN: Soft, non-tender, non-distended MUSCULOSKELETAL:  No edema; No deformity  SKIN: Warm and dry NEUROLOGIC:  Alert and oriented x 3 PSYCHIATRIC:  Normal affect    Signed, 09/18/20, MD  01/05/2021 3:08 PM    [facility]

## 2021-01-06 DIAGNOSIS — Z952 Presence of prosthetic heart valve: Secondary | ICD-10-CM | POA: Diagnosis not present

## 2021-01-08 DIAGNOSIS — Z952 Presence of prosthetic heart valve: Secondary | ICD-10-CM | POA: Diagnosis not present

## 2021-01-11 DIAGNOSIS — Z952 Presence of prosthetic heart valve: Secondary | ICD-10-CM | POA: Diagnosis not present

## 2021-01-13 DIAGNOSIS — Z952 Presence of prosthetic heart valve: Secondary | ICD-10-CM | POA: Diagnosis not present

## 2021-01-18 DIAGNOSIS — Z952 Presence of prosthetic heart valve: Secondary | ICD-10-CM | POA: Diagnosis not present

## 2021-01-19 DIAGNOSIS — Z952 Presence of prosthetic heart valve: Secondary | ICD-10-CM | POA: Diagnosis not present

## 2021-01-20 DIAGNOSIS — Z952 Presence of prosthetic heart valve: Secondary | ICD-10-CM | POA: Diagnosis not present

## 2021-02-01 DIAGNOSIS — L0390 Cellulitis, unspecified: Secondary | ICD-10-CM | POA: Diagnosis not present

## 2021-02-09 DIAGNOSIS — L03113 Cellulitis of right upper limb: Secondary | ICD-10-CM | POA: Diagnosis not present

## 2021-02-22 ENCOUNTER — Other Ambulatory Visit: Payer: Self-pay | Admitting: Cardiology

## 2021-02-23 NOTE — Telephone Encounter (Signed)
Clopidogrel 75 mg # 90 x 3 refill sent to  [id number] - [place], [place] - [address]

## 2021-03-24 DIAGNOSIS — H401133 Primary open-angle glaucoma, bilateral, severe stage: Secondary | ICD-10-CM | POA: Diagnosis not present

## 2021-03-24 DIAGNOSIS — H353132 Nonexudative age-related macular degeneration, bilateral, intermediate dry stage: Secondary | ICD-10-CM | POA: Diagnosis not present

## 2021-03-24 DIAGNOSIS — H25813 Combined forms of age-related cataract, bilateral: Secondary | ICD-10-CM | POA: Diagnosis not present

## 2021-04-08 DIAGNOSIS — H401133 Primary open-angle glaucoma, bilateral, severe stage: Secondary | ICD-10-CM | POA: Diagnosis not present

## 2021-04-08 DIAGNOSIS — H353131 Nonexudative age-related macular degeneration, bilateral, early dry stage: Secondary | ICD-10-CM | POA: Diagnosis not present

## 2021-04-08 DIAGNOSIS — H43813 Vitreous degeneration, bilateral: Secondary | ICD-10-CM | POA: Diagnosis not present

## 2021-04-08 DIAGNOSIS — H25813 Combined forms of age-related cataract, bilateral: Secondary | ICD-10-CM | POA: Diagnosis not present

## 2021-04-30 ENCOUNTER — Telehealth: Payer: Self-pay | Admitting: Cardiology

## 2021-04-30 MED ORDER — CARVEDILOL 6.25 MG PO TABS: 6.2500 mg | ORAL_TABLET | Freq: Two times a day (BID) | ORAL | 3 refills | Status: DC

## 2021-04-30 NOTE — Telephone Encounter (Signed)
?*  STAT* If patient is at the pharmacy, call can be transferred to refill team. ? ? ?1. Which medications need to be refilled? (please list name of each medication and dose if known) valsartan (DIOVAN) 40 MG tablet  ?carvedilol (COREG) 6.25 MG tablet ? ?2. Which pharmacy/location (including street and city if local pharmacy) is medication to be sent to? [id number] - [place], [place] - [address] ? ? ?3. Do they need a 30 day or 90 day supply? 90 day  ?

## 2021-04-30 NOTE — Telephone Encounter (Signed)
Carvedilol 6.25 mg # 180 x 3 refills sent to [facility] ?Valsartan refill was not sent, records shows it was d/c 02/04/2021  ?

## 2021-06-17 DIAGNOSIS — H401133 Primary open-angle glaucoma, bilateral, severe stage: Secondary | ICD-10-CM | POA: Diagnosis not present

## 2021-06-24 DIAGNOSIS — H401133 Primary open-angle glaucoma, bilateral, severe stage: Secondary | ICD-10-CM | POA: Diagnosis not present

## 2021-06-24 DIAGNOSIS — H353132 Nonexudative age-related macular degeneration, bilateral, intermediate dry stage: Secondary | ICD-10-CM | POA: Diagnosis not present

## 2021-06-24 DIAGNOSIS — H25813 Combined forms of age-related cataract, bilateral: Secondary | ICD-10-CM | POA: Diagnosis not present

## 2021-07-10 NOTE — Progress Notes (Signed)
?Cardiology Office Note:   ? ?Date:  07/12/2021  ? ?ID:  Stephanie Rosario, DOB [birth year], MRN [id number] ? ?PCP:  Marylen Ponto, MD  ?Cardiologist:  Norman Herrlich, MD   ? ?Referring MD: Marylen Ponto, MD  ? ? ?ASSESSMENT:   ? ?1. S/P TAVR (transcatheter aortic valve replacement)   ?2. Benign essential hypertension   ?3. Pure hypercholesterolemia   ?4. Mild CAD   ? ?PLAN:   ? ?In order of problems listed above: ? ?She has done well New York Heart Association class I on single antiplatelet agent aspirin has arrangements for echocardiogram for structural heart program in [place] declines lipid-lowering treatment I will plan to see her back in the office in 1 year ?Continue fire ambulatory blood pressures and intervention continue her ARB ?Presently not on lipid-lowering treatment her decision ?Stable asymptomatic no anginal discomfort ? ? ?Next appointment: 1 year ? ? ?Medication Adjustments/Labs and Tests Ordered: ?Current medicines are reviewed at length with the patient today.  Concerns regarding medicines are outlined above.  ?No orders of the defined types were placed in this encounter. ? ?No orders of the defined types were placed in this encounter. ? ? ?Chief Complaint  ?Patient presents with  ? Follow-up  ?  After TAVR June 2022  ? ? ?History of Present Illness:   ? ?Stephanie Rosario is a 81 y.o. female with a hx of congenital heart disease with surgical repair coarctation of the aorta at age 11 hypertension hyperlipidemia carotid artery disease sleep apnea mild nonobstructive CAD and symptomatic critical aortic stenosis with TAVR 08/21/2020 she was last seen 01/05/2021.  Her echocardiogram 09/16/2020 showed normal TAVR function EF 70 to 75% with mild LVH ?Compliance with diet, lifestyle and medications: Yes ? ?She follows her blood pressure at home runs in the 120-130/70-80 range with good technique ?As always she has an exaggerated blood response in her medical office ?She has fully recovered from TAVR-no edema  shortness of breath chest pain palpitation or syncope ?We discussed having echocardiogram done in the office but she has arrangements to follow-up with the structural heart program in [place] for her testing ?Past Medical History:  ?Diagnosis Date  ? Aortic stenosis   ? Benign essential hypertension   ? Benign paroxysmal positional vertigo   ? History of nephrolithiasis   ? Nuclear sclerotic cataract of both eyes 12/30/2013  ? Primary open angle glaucoma of both eyes, severe stage 12/30/2013  ? Pure hypercholesterolemia   ? Severe aortic stenosis   ? Shortness of breath 07/24/2020  ? Sleep apnea   ? CPAP  ? ? ?Past Surgical History:  ?Procedure Laterality Date  ? BREAST BIOPSY    ? Benign  ? COARCTATION OF AORTA REPAIR    ? Age 8  ? INTRAOPERATIVE TRANSTHORACIC ECHOCARDIOGRAM N/A 08/21/2020  ? Procedure: INTRAOPERATIVE TRANSTHORACIC ECHOCARDIOGRAM;  Surgeon: Kathleene Hazel, MD;  Location: [facility] OR;  Service: Open Heart Surgery;  Laterality: N/A;  ? RIGHT/LEFT HEART CATH AND CORONARY ANGIOGRAPHY N/A 08/05/2020  ? Procedure: RIGHT/LEFT HEART CATH AND CORONARY ANGIOGRAPHY;  Surgeon: Kathleene Hazel, MD;  Location: [facility];  Service: Cardiovascular;  Laterality: N/A;  ? TRANSCATHETER AORTIC VALVE REPLACEMENT, TRANSFEMORAL N/A 08/21/2020  ? Procedure: TRANSCATHETER AORTIC VALVE REPLACEMENT, TRANSFEMORAL;  Surgeon: Kathleene Hazel, MD;  Location: [facility] OR;  Service: Open Heart Surgery;  Laterality: N/A;  ? ? ?Current Medications: ?Current Meds  ?Medication Sig  ? acetaminophen (TYLENOL) 325 MG tablet Take 2 tablets (650 mg total)  by mouth every 6 (six) hours as needed for mild pain (or Fever >/= 101).  ? amoxicillin (AMOXIL) 500 MG tablet Take 4 tablets (2000 mg) by mouth ONE HOUR before any dental procedures  ? aspirin 81 MG EC tablet Take 81 mg by mouth daily. Swallow whole.  ? brimonidine (ALPHAGAN) 0.2 % ophthalmic solution Place 1 drop into both eyes 3 (three) times daily.  ? carvedilol  (COREG) 6.25 MG tablet Take 1 tablet (6.25 mg total) by mouth 2 (two) times daily.  ? dorzolamide (TRUSOPT) 2 % ophthalmic solution Place 1 drop into both eyes 3 (three) times daily.  ? fluticasone (FLONASE) 50 MCG/ACT nasal spray Place 2 sprays into both nostrils as needed for congestion.  ? hydroxypropyl methylcellulose / hypromellose (ISOPTO TEARS / GONIOVISC) 2.5 % ophthalmic solution Place 1 drop into both eyes daily.  ? ibuprofen (ADVIL) 200 MG tablet Take 200 mg by mouth every 8 (eight) hours as needed for headache or moderate pain.  ? Latanoprostene Bunod (VYZULTA) 0.024 % SOLN Place 1 drop into both eyes at bedtime.  ? Netarsudil Dimesylate (RHOPRESSA) 0.02 % SOLN Place 1 drop into both eyes at bedtime.  ? valsartan (DIOVAN) 40 MG tablet Take 40 mg by mouth 2 (two) times daily.  ?  ? ?Allergies:   Sulfa antibiotics  ? ?Social History  ? ?Socioeconomic History  ? Marital status: Unknown  ?  Spouse name: Not on file  ? Number of children: Not on file  ? Years of education: Not on file  ? Highest education level: Not on file  ?Occupational History  ? Not on file  ?Tobacco Use  ? Smoking status: Never  ?  Passive exposure: Never  ? Smokeless tobacco: Never  ?Vaping Use  ? Vaping Use: Never used  ?Substance and Sexual Activity  ? Alcohol use: Yes  ?  Alcohol/week: 1.0 standard drink  ?  Types: 1 Glasses of wine per week  ?  Comment: Rare  ? Drug use: Never  ? Sexual activity: Not on file  ?Other Topics Concern  ? Not on file  ?Social History Narrative  ? Not on file  ? ?Social Determinants of Health  ? ?Financial Resource Strain: Not on file  ?Food Insecurity: Not on file  ?Transportation Needs: Not on file  ?Physical Activity: Not on file  ?Stress: Not on file  ?Social Connections: Not on file  ?  ? ?Family History: ?The patient's family history includes Congestive Heart Failure in her father; Heart attack in her mother; Hypertension in her sister. ?ROS:   ?Please see the history of present illness.    ?All  other systems reviewed and are negative. ? ?EKGs/Labs/Other Studies Reviewed:   ? ?The following studies were reviewed today: ? ? ?Recent Labs: ?08/21/2020: ALT 15 ?08/22/2020: Hemoglobin 9.8; Magnesium 1.9; Platelets 140 ?10/19/2020: BUN 26; Creatinine, Ser 0.98; Potassium 4.1; Sodium 142  ?Recent Lipid Panel ?   ?Component Value Date/Time  ? CHOL 164 12/17/2020 0942  ? TRIG 87 12/17/2020 0942  ? HDL 51 12/17/2020 0942  ? CHOLHDL 3.2 12/17/2020 0942  ? LDLCALC 97 12/17/2020 0942  ? ? ?Physical Exam:   ? ?VS:  BP (!) 200/84 (BP Location: Right Arm, Patient Position: Sitting)   Pulse 96   Ht 5\' 2"  (1.575 m)   Wt 143 lb 9.6 oz (65.1 kg)   SpO2 98%   BMI 26.26 kg/m?    ? ?Wt Readings from Last 3 Encounters:  ?07/12/21 143 lb 9.6 oz (  65.1 kg)  ?01/05/21 145 lb (65.8 kg)  ?11/05/20 149 lb (67.6 kg)  ?  ? ?GEN:  Well nourished, well developed in no acute distress ?HEENT: Normal ?NECK: No JVD; No carotid bruits ?LYMPHATICS: No lymphadenopathy ?CARDIAC: 1 of 6 localized flow murmur aortic area no regurgitation S2 normal RRR, no murmurs, rubs, gallops ?RESPIRATORY:  Clear to auscultation without rales, wheezing or rhonchi  ?ABDOMEN: Soft, non-tender, non-distended ?MUSCULOSKELETAL:  No edema; No deformity  ?SKIN: Warm and dry ?NEUROLOGIC:  Alert and oriented x 3 ?PSYCHIATRIC:  Normal affect  ? ? ?Signed, ?Norman HerrlichBrian Pradeep Beaubrun, MD  ?07/12/2021 1:40 PM    ?[facility]  ?

## 2021-07-12 ENCOUNTER — Ambulatory Visit (INDEPENDENT_AMBULATORY_CARE_PROVIDER_SITE_OTHER): Payer: Medicare Other | Admitting: Cardiology

## 2021-07-12 ENCOUNTER — Encounter: Payer: Self-pay | Admitting: Cardiology

## 2021-07-12 VITALS — BP 200/84 | HR 96 | Ht 62.0 in | Wt 143.6 lb

## 2021-07-12 DIAGNOSIS — I2510 Atherosclerotic heart disease of native coronary artery without angina pectoris: Secondary | ICD-10-CM

## 2021-07-12 DIAGNOSIS — I10 Essential (primary) hypertension: Secondary | ICD-10-CM

## 2021-07-12 DIAGNOSIS — E7800 Pure hypercholesterolemia, unspecified: Secondary | ICD-10-CM

## 2021-07-12 DIAGNOSIS — Z952 Presence of prosthetic heart valve: Secondary | ICD-10-CM | POA: Diagnosis not present

## 2021-07-12 NOTE — Patient Instructions (Signed)

## 2021-07-15 DIAGNOSIS — Z6826 Body mass index (BMI) 26.0-26.9, adult: Secondary | ICD-10-CM | POA: Diagnosis not present

## 2021-07-15 DIAGNOSIS — Z0000 Encounter for general adult medical examination without abnormal findings: Secondary | ICD-10-CM | POA: Diagnosis not present

## 2021-07-15 DIAGNOSIS — Z1331 Encounter for screening for depression: Secondary | ICD-10-CM | POA: Diagnosis not present

## 2021-07-15 DIAGNOSIS — E7800 Pure hypercholesterolemia, unspecified: Secondary | ICD-10-CM | POA: Diagnosis not present

## 2021-07-15 DIAGNOSIS — R7303 Prediabetes: Secondary | ICD-10-CM | POA: Diagnosis not present

## 2021-08-06 IMAGING — CT CT HEART MORP W/ CTA COR W/ SCORE W/ CA W/CM &/OR W/O CM
1 series · 5 of 12 positions shown, 7 images · non-contrast
Comparison: None.
COMPARISON: None.

Addendum:
EXAM:
OVER-READ INTERPRETATION  CT CHEST

The following report is an over-read performed by radiologist Dr.
Shalley Jim [[email or web address]] on 08/12/2020. This
over-read does not include interpretation of cardiac or coronary
anatomy or pathology. The coronary calcium score/coronary CTA
interpretation by the cardiologist is attached.
CLINICAL DATA: Aortic stenosis
History of distant aortic procedure NOS
Cardiac TAVR CT
TECHNIQUE: The patient was scanned on a Siemens Force [REDACTED]ice scanner. A 120
kV retrospective scan was triggered in the descending thoracic aorta
at 111 HU's. Gantry rotation speed was 270 msecs and collimation was
.9 mm. No beta blockade or nitro were given. The 3D data set was
reconstructed in 5% intervals of the R-R cycle. Systolic and
diastolic phases were analyzed on a dedicated work station using
MPR, MIP and VRT modes. The patient received 95 cc of contrast.

[Series 220: — · 0.46mm/px · 5 of 12 slices shown, 7 images]
[im 3/12  vessel]
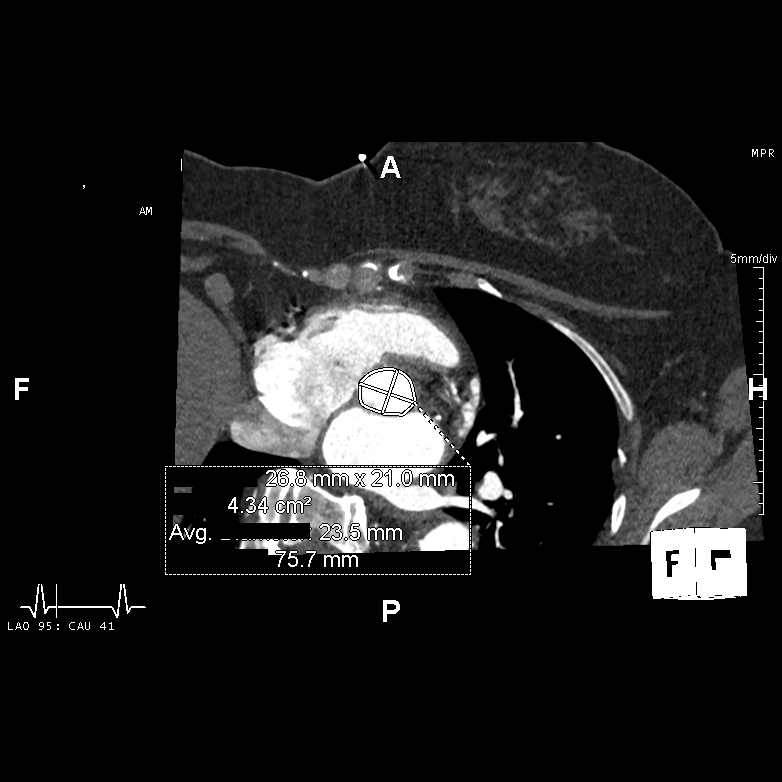
[im 3/12  lung]
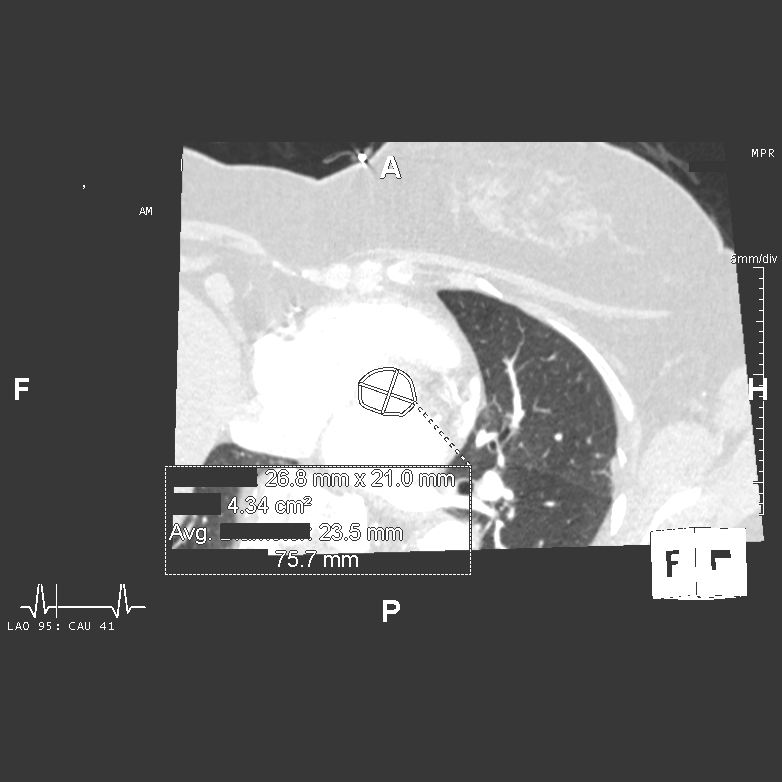
[im 5/12  vessel]
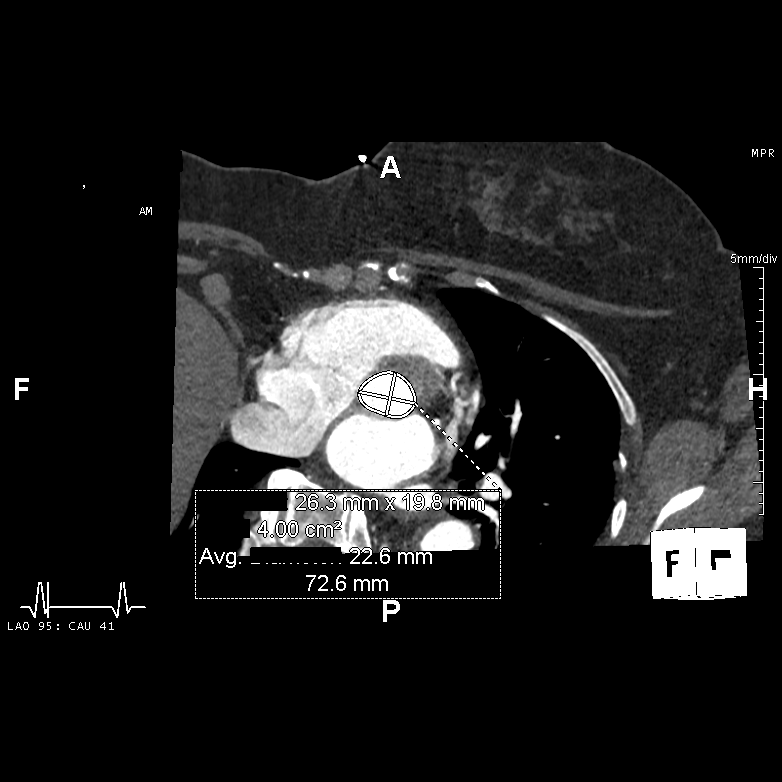
[im 7/12  vessel]
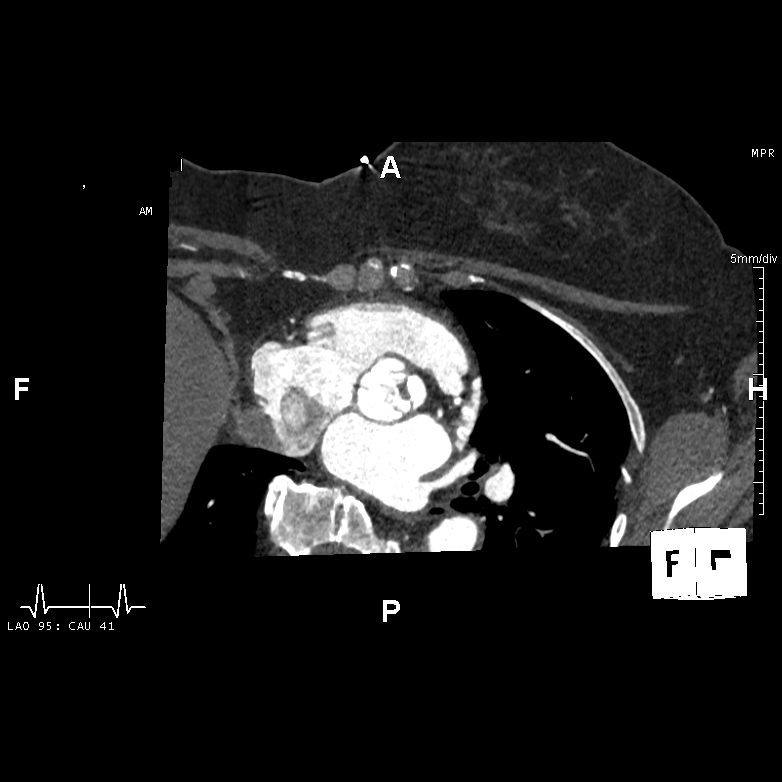
[im 9/12  vessel]
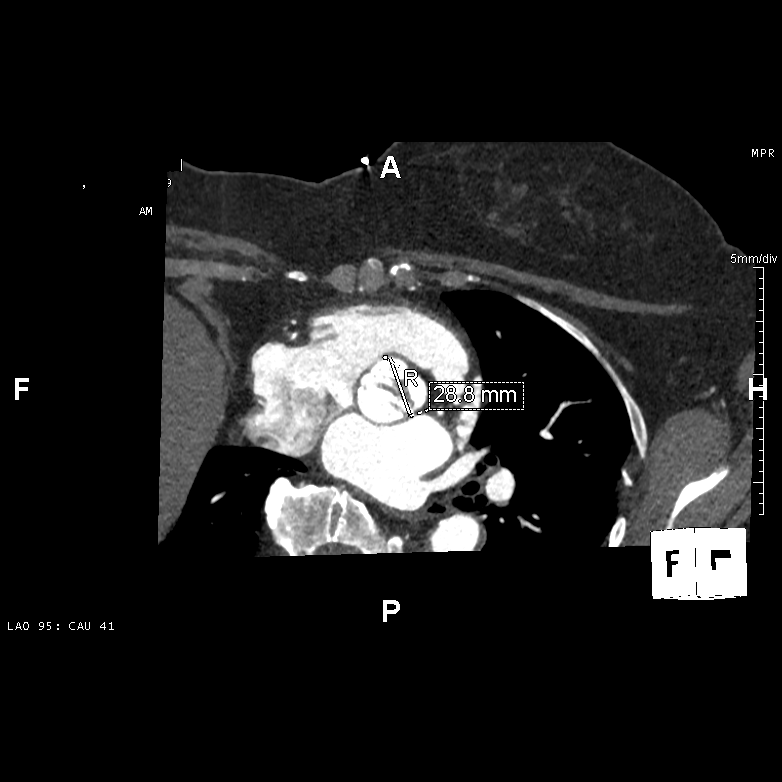
[im 11/12  vessel]
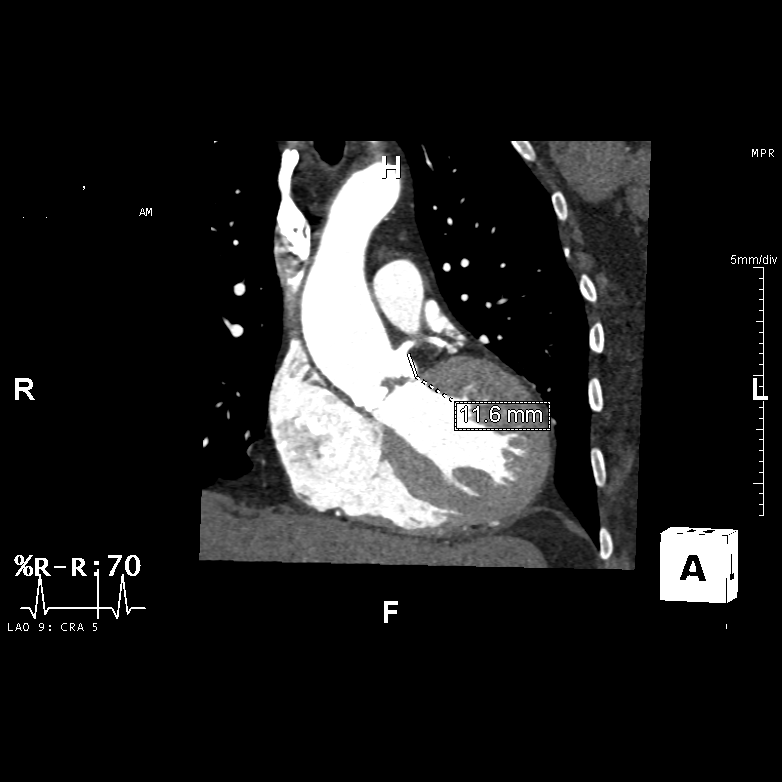
[im 11/12  lung]
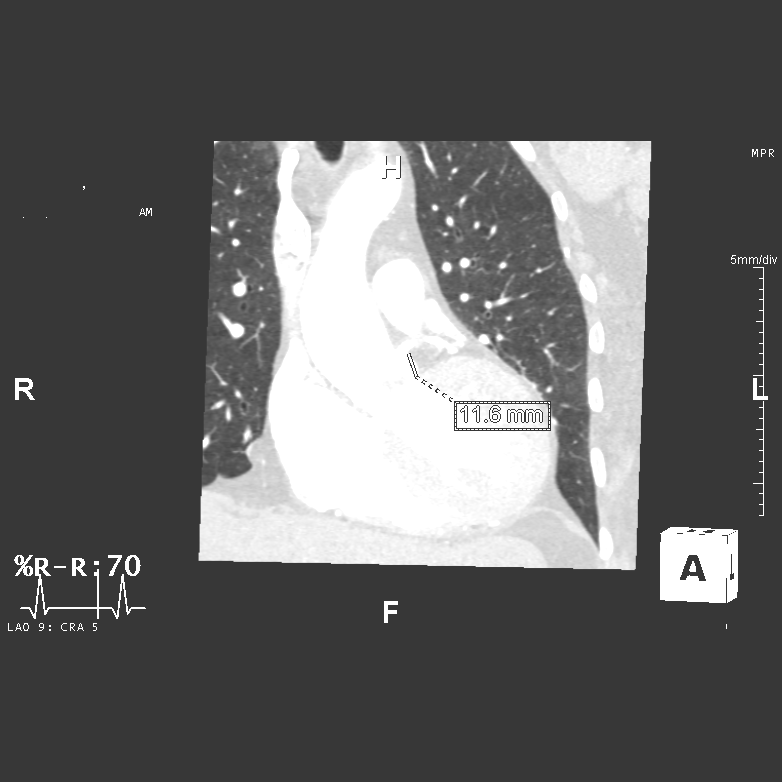

[5 of 12 positions shown; findings below may reference images not displayed]

FINDINGS: Extracardiac findings will be described separately under dictation
for contemporaneously obtained CTA chest, abdomen and pelvis.
IMPRESSION: Please see separate dictation for contemporaneously obtained CTA
chest, abdomen and pelvis dated 08/12/2020 for full description of
relevant extracardiac findings.
FINDINGS: Aortic Valve: Severely thickened aortic valve with heavy
calcification and reduced excursion the planimeter valve area is
0.897 Sq cm consistent with severe aortic stenosis

Number of leaflets: There is calcific fusion: valve appears
functionally bicuspid

LVOT calcification: None

Annular calcification: Minimal

Aortic Valve Calcium Score: 4180

Prosthetic Valve: NA

Mitral Valve: No MILL

LV: Left ventricular function is normal with estimated LVEF 60-65%.

Aortic Annulus Measurements

Major annulus diameter: 27 mm

Minor annulus diameter: 21 mm

Annular perimeter: 4.34  cm2

Annular area: 76 mm

Aortic Root Measurements-

Sinus of Valsalva perimeter: 102 mm

Sinotubular Junction: 30 mm

Ascending Thoracic Aorta: 40 mm

Aortic Arch: 24 mm

Descending Thoracic Aorta: 14 mm X 14 mm at narrowest that
reconstitutes to 19 mm below narrowing (< 50% change)

Descending aortic atherosclerosis noted.

No evidence of aortic collateral flow or post surgical changes

Sinus of Valsalva Measurements:

Right coronary cusp width: 29 mm

Left coronary cusp width: 30 mm

Non coronary cusp width: 32 mm

Coronary Artery Height above Annulus:

Left Main: 11.6 mm

Right Coronary: 9.6  mm

Coronary Calcium Score:

Left main: 10

Left anterior descending artery: 132

Left circumflex artery: 36

Right coronary artery: 106

Total: 284

Percentile: 69th for age, sex, and race matched control.

Optimum Fluoroscopic Angle for Delivery: LAO 1, MOATSHE 4 (Anterior
View)

Valves for structural team consideration: 29 mm CoreValve; 26 mm
Edwards Sapien
IMPRESSION: 1. Severe Aortic stenosis. Findings pertinent to TAVR procedure are
detailed above.

2. Descending aortic narrow as above; no post surgical changes
noted.

3. Coronary calcium score of 287. This was 69th percentile for age,
sex, and race matched control.

4.  Mild aortic aneurysm: 40 mm.

RECOMMENDATIONS:



If CAC = 0, it is reasonable to withhold statin therapy and reassess
in 5 to 10 years, as long as higher risk conditions are absent
(diabetes mellitus, family history of premature CHD in first degree
relatives (males <55 years; females <65 years), cigarette smoking,
LDL >=190 mg/dL or other independent risk factors).

If CAC is 1 to 99, it is reasonable to initiate statin therapy for
patients >=55 years of age.

If CAC is >=100 or >=75th percentile, it is reasonable to initiate
statin therapy at any age.

Cardiology referral should be considered for patients with CAC
scores >=400 or >=75th percentile.

*2047 AHA/ACC/AACVPR/AAPA/ABC/UM WARD/TOPUP/SARITAMA/Tran Thuy/CLAUDIO JOSE/QUIRIJN/ERXLEBEN
Guideline on the Management of Blood Cholesterol: A Report of the
American College of Cardiology/American Heart Association Task Force
on Clinical Practice Guidelines. J Am Coll Cardiol.
7162;73(24):1868-1320.

Dontea Niz

*** End of Addendum ***
EXAM:
OVER-READ INTERPRETATION  CT CHEST

The following report is an over-read performed by radiologist Dr.
Shalley Jim [REDACTED] on 08/12/2020. This
over-read does not include interpretation of cardiac or coronary
anatomy or pathology. The coronary calcium score/coronary CTA
interpretation by the cardiologist is attached.
FINDINGS: Extracardiac findings will be described separately under dictation
for contemporaneously obtained CTA chest, abdomen and pelvis.
IMPRESSION: Please see separate dictation for contemporaneously obtained CTA
chest, abdomen and pelvis dated 08/12/2020 for full description of
relevant extracardiac findings.

## 2021-08-15 IMAGING — DX DG CHEST 1V PORT
1 series · 1 of 1 positions shown · non-contrast
Comparison: Earlier today.

CLINICAL DATA: Spastic post transcatheter aortic valve replacement.

EXAM:
PORTABLE CHEST 1 VIEW

[chest]
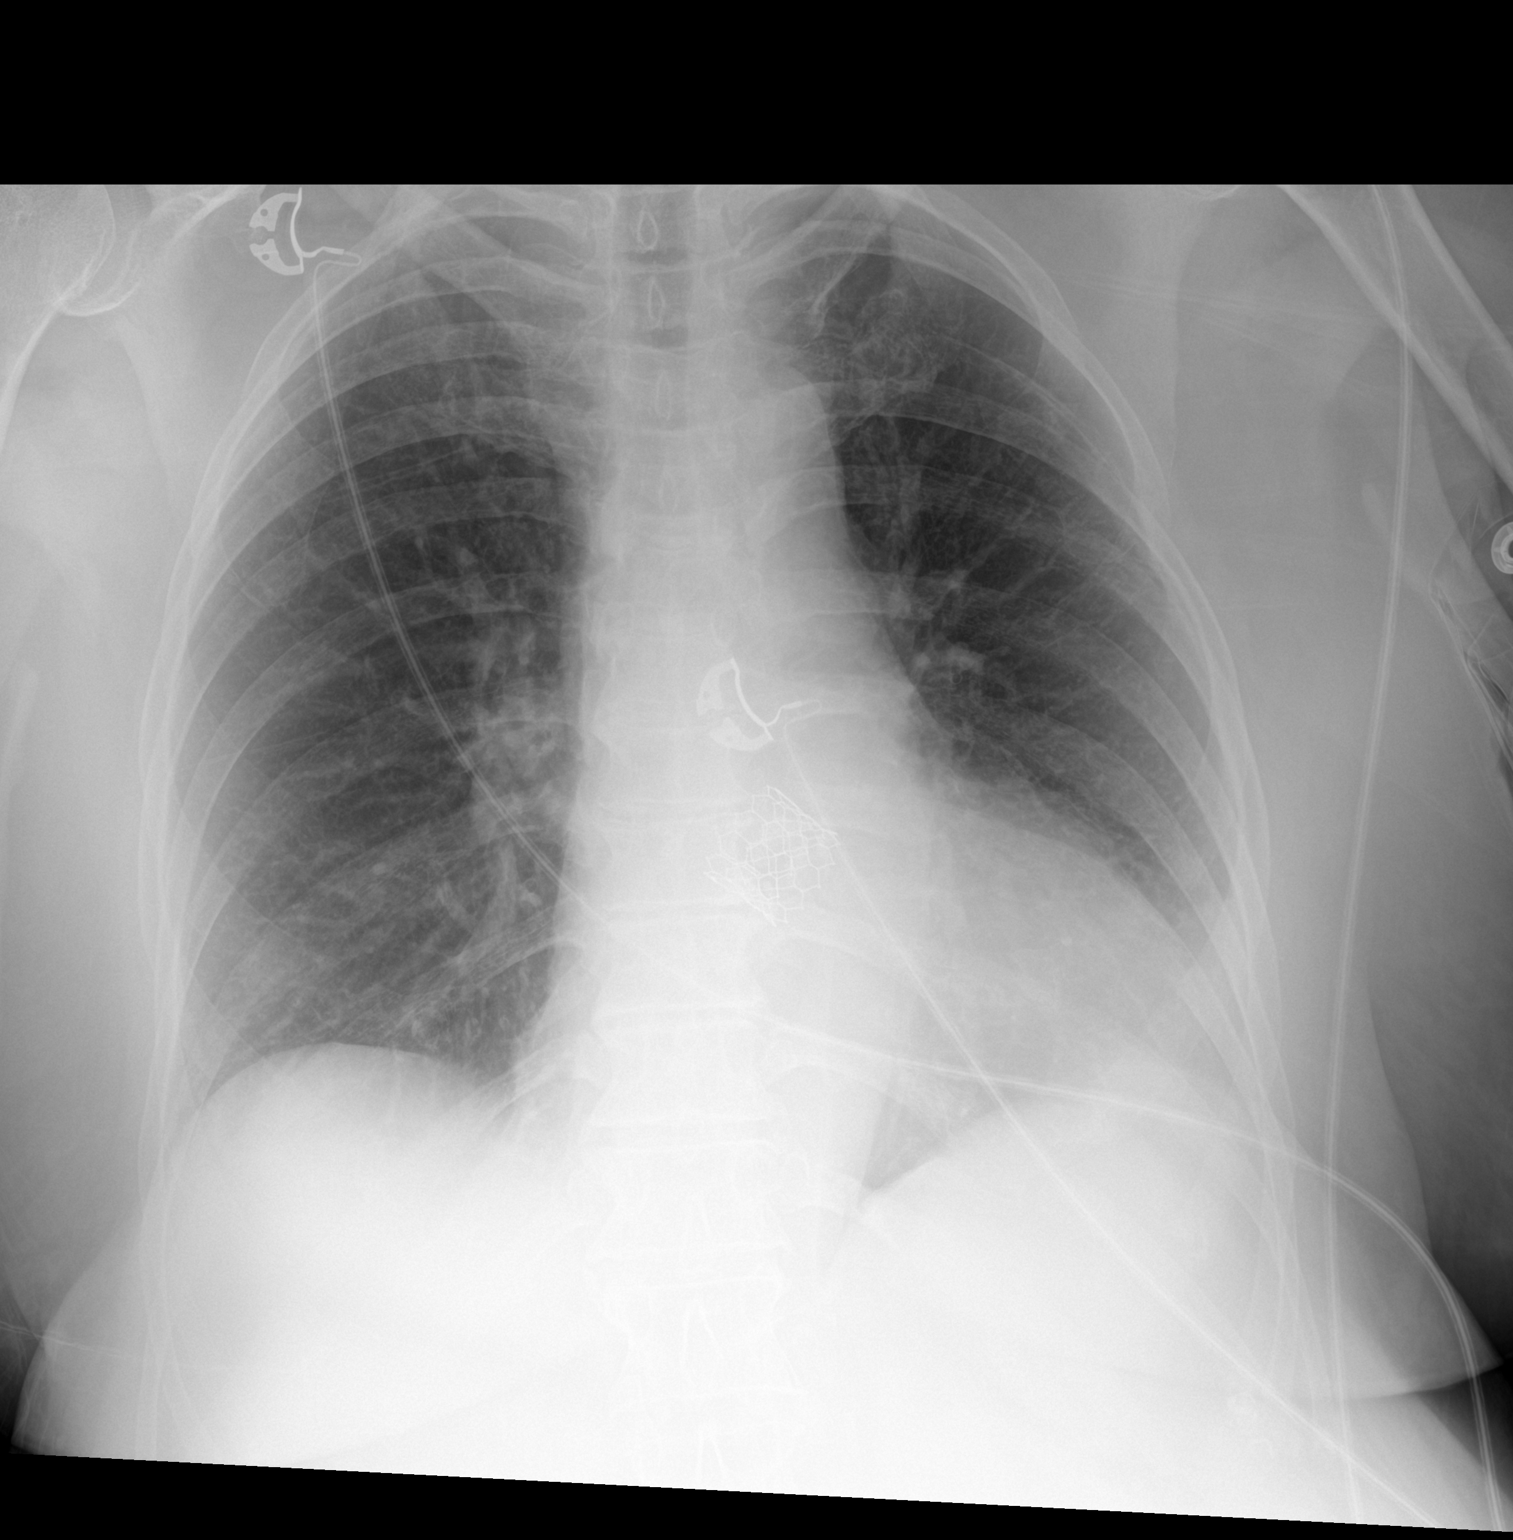

[1 of 1 positions shown; findings below may reference images not displayed]

FINDINGS: Mildly enlarged cardiac silhouette. Interval aortic stent valve.
Minimal left basilar atelectasis. Otherwise, clear lungs. No
pneumothorax. Mild thoracic spine degenerative changes.
IMPRESSION: 1. Interval transcatheter aortic valve replacement.
2. Minimal left basilar atelectasis.

## 2021-08-20 ENCOUNTER — Ambulatory Visit (HOSPITAL_COMMUNITY): Payer: Medicare Other | Attending: Cardiology

## 2021-08-20 ENCOUNTER — Ambulatory Visit (INDEPENDENT_AMBULATORY_CARE_PROVIDER_SITE_OTHER): Payer: Medicare Other | Admitting: Physician Assistant

## 2021-08-20 ENCOUNTER — Other Ambulatory Visit (HOSPITAL_COMMUNITY): Payer: Medicare Other

## 2021-08-20 VITALS — BP 162/90 | HR 74 | Ht 62.0 in | Wt 144.0 lb

## 2021-08-20 DIAGNOSIS — I2510 Atherosclerotic heart disease of native coronary artery without angina pectoris: Secondary | ICD-10-CM | POA: Diagnosis not present

## 2021-08-20 DIAGNOSIS — I10 Essential (primary) hypertension: Secondary | ICD-10-CM | POA: Insufficient documentation

## 2021-08-20 DIAGNOSIS — Z952 Presence of prosthetic heart valve: Secondary | ICD-10-CM

## 2021-08-20 DIAGNOSIS — I7121 Aneurysm of the ascending aorta, without rupture: Secondary | ICD-10-CM

## 2021-08-20 DIAGNOSIS — I779 Disorder of arteries and arterioles, unspecified: Secondary | ICD-10-CM | POA: Diagnosis not present

## 2021-08-20 DIAGNOSIS — M10272 Drug-induced gout, left ankle and foot: Secondary | ICD-10-CM | POA: Diagnosis not present

## 2021-08-20 DIAGNOSIS — E7800 Pure hypercholesterolemia, unspecified: Secondary | ICD-10-CM

## 2021-08-20 LAB — ECHOCARDIOGRAM COMPLETE
AR max vel: 1.36 cm2
AV Area VTI: 1.43 cm2
AV Area mean vel: 1.34 cm2
AV Mean grad: 10.5 mmHg
AV Peak grad: 19.4 mmHg
Ao pk vel: 2.2 m/s
Area-P 1/2: 3.17 cm2
S' Lateral: 2 cm

## 2021-08-26 ENCOUNTER — Ambulatory Visit (INDEPENDENT_AMBULATORY_CARE_PROVIDER_SITE_OTHER): Payer: Medicare Other

## 2021-08-26 DIAGNOSIS — I6521 Occlusion and stenosis of right carotid artery: Secondary | ICD-10-CM

## 2021-08-26 DIAGNOSIS — I779 Disorder of arteries and arterioles, unspecified: Secondary | ICD-10-CM | POA: Diagnosis not present

## 2021-09-16 ENCOUNTER — Other Ambulatory Visit (HOSPITAL_COMMUNITY): Payer: Medicare Other

## 2021-10-18 ENCOUNTER — Other Ambulatory Visit: Payer: Self-pay | Admitting: Cardiology

## 2021-10-18 DIAGNOSIS — H401133 Primary open-angle glaucoma, bilateral, severe stage: Secondary | ICD-10-CM | POA: Diagnosis not present

## 2021-10-25 DIAGNOSIS — H401133 Primary open-angle glaucoma, bilateral, severe stage: Secondary | ICD-10-CM | POA: Diagnosis not present

## 2021-10-25 DIAGNOSIS — H25813 Combined forms of age-related cataract, bilateral: Secondary | ICD-10-CM | POA: Diagnosis not present

## 2021-10-25 DIAGNOSIS — H353132 Nonexudative age-related macular degeneration, bilateral, intermediate dry stage: Secondary | ICD-10-CM | POA: Diagnosis not present

## 2021-11-17 DIAGNOSIS — Z23 Encounter for immunization: Secondary | ICD-10-CM | POA: Diagnosis not present

## 2021-12-02 DIAGNOSIS — Z23 Encounter for immunization: Secondary | ICD-10-CM | POA: Diagnosis not present

## 2021-12-16 DIAGNOSIS — H25813 Combined forms of age-related cataract, bilateral: Secondary | ICD-10-CM | POA: Diagnosis not present

## 2022-02-03 DIAGNOSIS — Z7982 Long term (current) use of aspirin: Secondary | ICD-10-CM | POA: Diagnosis not present

## 2022-02-03 DIAGNOSIS — Z79899 Other long term (current) drug therapy: Secondary | ICD-10-CM | POA: Diagnosis not present

## 2022-02-03 DIAGNOSIS — G4730 Sleep apnea, unspecified: Secondary | ICD-10-CM | POA: Diagnosis not present

## 2022-02-03 DIAGNOSIS — Z882 Allergy status to sulfonamides status: Secondary | ICD-10-CM | POA: Diagnosis not present

## 2022-02-03 DIAGNOSIS — R011 Cardiac murmur, unspecified: Secondary | ICD-10-CM | POA: Diagnosis not present

## 2022-02-03 DIAGNOSIS — Z952 Presence of prosthetic heart valve: Secondary | ICD-10-CM | POA: Diagnosis not present

## 2022-02-03 DIAGNOSIS — E785 Hyperlipidemia, unspecified: Secondary | ICD-10-CM | POA: Diagnosis not present

## 2022-02-03 DIAGNOSIS — H401113 Primary open-angle glaucoma, right eye, severe stage: Secondary | ICD-10-CM | POA: Diagnosis not present

## 2022-02-03 DIAGNOSIS — H25811 Combined forms of age-related cataract, right eye: Secondary | ICD-10-CM | POA: Diagnosis not present

## 2022-02-03 DIAGNOSIS — I10 Essential (primary) hypertension: Secondary | ICD-10-CM | POA: Diagnosis not present

## 2022-02-04 DIAGNOSIS — H25812 Combined forms of age-related cataract, left eye: Secondary | ICD-10-CM | POA: Diagnosis not present

## 2022-02-25 DIAGNOSIS — I10 Essential (primary) hypertension: Secondary | ICD-10-CM | POA: Diagnosis not present

## 2022-02-25 DIAGNOSIS — Z6827 Body mass index (BMI) 27.0-27.9, adult: Secondary | ICD-10-CM | POA: Diagnosis not present

## 2022-03-03 DIAGNOSIS — Z7982 Long term (current) use of aspirin: Secondary | ICD-10-CM | POA: Diagnosis not present

## 2022-03-03 DIAGNOSIS — H401133 Primary open-angle glaucoma, bilateral, severe stage: Secondary | ICD-10-CM | POA: Diagnosis not present

## 2022-03-03 DIAGNOSIS — Z9841 Cataract extraction status, right eye: Secondary | ICD-10-CM | POA: Diagnosis not present

## 2022-03-03 DIAGNOSIS — I10 Essential (primary) hypertension: Secondary | ICD-10-CM | POA: Diagnosis not present

## 2022-03-03 DIAGNOSIS — E785 Hyperlipidemia, unspecified: Secondary | ICD-10-CM | POA: Diagnosis not present

## 2022-03-03 DIAGNOSIS — I2510 Atherosclerotic heart disease of native coronary artery without angina pectoris: Secondary | ICD-10-CM | POA: Diagnosis not present

## 2022-03-03 DIAGNOSIS — H5703 Miosis: Secondary | ICD-10-CM | POA: Diagnosis not present

## 2022-03-03 DIAGNOSIS — Z882 Allergy status to sulfonamides status: Secondary | ICD-10-CM | POA: Diagnosis not present

## 2022-03-03 DIAGNOSIS — G4730 Sleep apnea, unspecified: Secondary | ICD-10-CM | POA: Diagnosis not present

## 2022-03-03 DIAGNOSIS — Z961 Presence of intraocular lens: Secondary | ICD-10-CM | POA: Diagnosis not present

## 2022-03-03 DIAGNOSIS — H353132 Nonexudative age-related macular degeneration, bilateral, intermediate dry stage: Secondary | ICD-10-CM | POA: Diagnosis not present

## 2022-03-03 DIAGNOSIS — Z7902 Long term (current) use of antithrombotics/antiplatelets: Secondary | ICD-10-CM | POA: Diagnosis not present

## 2022-03-03 DIAGNOSIS — H25812 Combined forms of age-related cataract, left eye: Secondary | ICD-10-CM | POA: Diagnosis not present

## 2022-03-03 DIAGNOSIS — Z79899 Other long term (current) drug therapy: Secondary | ICD-10-CM | POA: Diagnosis not present

## 2022-03-03 DIAGNOSIS — H401123 Primary open-angle glaucoma, left eye, severe stage: Secondary | ICD-10-CM | POA: Diagnosis not present

## 2022-07-16 ENCOUNTER — Other Ambulatory Visit: Payer: Self-pay | Admitting: Cardiology

## 2022-07-18 DIAGNOSIS — H401133 Primary open-angle glaucoma, bilateral, severe stage: Secondary | ICD-10-CM | POA: Diagnosis not present

## 2022-07-18 NOTE — Telephone Encounter (Signed)
Rx to pharmacy, needs appointment for future refills / 1st attempt 

## 2022-07-22 DIAGNOSIS — H353132 Nonexudative age-related macular degeneration, bilateral, intermediate dry stage: Secondary | ICD-10-CM | POA: Diagnosis not present

## 2022-07-22 DIAGNOSIS — H401133 Primary open-angle glaucoma, bilateral, severe stage: Secondary | ICD-10-CM | POA: Diagnosis not present

## 2022-07-29 DIAGNOSIS — I10 Essential (primary) hypertension: Secondary | ICD-10-CM | POA: Diagnosis not present

## 2022-07-29 DIAGNOSIS — E7800 Pure hypercholesterolemia, unspecified: Secondary | ICD-10-CM | POA: Diagnosis not present

## 2022-08-03 DIAGNOSIS — Z0000 Encounter for general adult medical examination without abnormal findings: Secondary | ICD-10-CM | POA: Diagnosis not present

## 2022-08-03 DIAGNOSIS — I10 Essential (primary) hypertension: Secondary | ICD-10-CM | POA: Diagnosis not present

## 2022-08-03 DIAGNOSIS — Z1339 Encounter for screening examination for other mental health and behavioral disorders: Secondary | ICD-10-CM | POA: Diagnosis not present

## 2022-08-03 DIAGNOSIS — Z6827 Body mass index (BMI) 27.0-27.9, adult: Secondary | ICD-10-CM | POA: Diagnosis not present

## 2022-08-04 DIAGNOSIS — E7800 Pure hypercholesterolemia, unspecified: Secondary | ICD-10-CM | POA: Diagnosis not present

## 2022-08-04 DIAGNOSIS — R7303 Prediabetes: Secondary | ICD-10-CM | POA: Diagnosis not present

## 2022-08-08 DIAGNOSIS — S80861A Insect bite (nonvenomous), right lower leg, initial encounter: Secondary | ICD-10-CM | POA: Diagnosis not present

## 2022-09-05 DIAGNOSIS — Z6827 Body mass index (BMI) 27.0-27.9, adult: Secondary | ICD-10-CM | POA: Diagnosis not present

## 2022-09-05 DIAGNOSIS — I10 Essential (primary) hypertension: Secondary | ICD-10-CM | POA: Diagnosis not present

## 2022-10-08 ENCOUNTER — Other Ambulatory Visit: Payer: Self-pay | Admitting: Cardiology

## 2022-10-23 ENCOUNTER — Other Ambulatory Visit: Payer: Self-pay | Admitting: Cardiology

## 2022-10-31 DIAGNOSIS — Z23 Encounter for immunization: Secondary | ICD-10-CM | POA: Diagnosis not present

## 2022-11-16 NOTE — Progress Notes (Unsigned)
Cardiology Office Note:    Date:  11/17/2022   ID:  Stephanie Rosario, DOB Mar 23, 1940, MRN [id number]  PCP:  Marylen Ponto, MD  Cardiologist:  Norman Herrlich, MD    Referring MD: Marylen Ponto, MD    ASSESSMENT:    1. S/P TAVR (transcatheter aortic valve replacement)   2. Benign essential hypertension   3. Pure hypercholesterolemia   4. Mild CAD    PLAN:    In order of problems listed above:  She continues to do well post TAVR will continue medical therapy including aspirin antihypertensives beta-blocker valsartan and resume lipid-lowering therapy with nonstatin low-dose Zetia. She declines an EKG this year we will schedule I 1 week before her yearly follow-up next visit Improved home BP is at target continue current treatment she said she get back to her regular exercise program Initiate lipid-lowering therapy nonstatin at her decision Doing well and having no anginal symptoms on good foundational medical therapy   Next appointment: 1 year   Medication Adjustments/Labs and Tests Ordered: Current medicines are reviewed at length with the patient today.  Concerns regarding medicines are outlined above.  No orders of the defined types were placed in this encounter.  No orders of the defined types were placed in this encounter.    History of Present Illness:    Stephanie Rosario is a 82 y.o. 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  Follow-up echocardiogram 08/20/2021 showed normal TAVR function mild enlargement ascending aorta and normal left ventricular function.  Compliance with diet, lifestyle and medications: Yes  She continues to have good long-term result from her TAVR vigorous active she was hiking in the [facility] She had quite a fall  and had some subcutaneous bruising but has had no mucosal bleeding She has been hesitant to accept lipid-lowering therapy worried about the development of diabetes with statins we discussed nonstatin therapy and she has agreed to try low-dose Zetia 5 mg daily follow-up labs in her PCP office She is having no cardiovascular symptoms of edema shortness of breath chest pain palpitation presyncope Discussed yearly surveillance echocardiograms after TAVR and she is decided not to do it this year so we will schedule I in 1 year a week before she sees me in follow-up She has had a stressful time in her life caring for her sister with severe medical problems it is interfering with her exercise her blood pressure is higher her dose of calcium channel blocker was increased and since that time home blood pressures run consistently in the low 130s over 80 Past Medical History:  Diagnosis Date   Aortic stenosis    Benign essential hypertension    Benign paroxysmal positional vertigo    History of nephrolithiasis    Nuclear sclerotic cataract of both eyes 12/30/2013   Primary open angle glaucoma of both eyes, severe stage 12/30/2013   Pure hypercholesterolemia    Severe aortic stenosis    Shortness of breath 07/24/2020   Sleep apnea    CPAP    Current Medications: Current Meds  Medication Sig   acetaminophen (TYLENOL) 325 MG tablet Take 2 tablets (650 mg total) by mouth every 6 (six) hours as needed for mild pain (or Fever >/= 101).   amoxicillin (AMOXIL) 500 MG tablet Take 4 tablets (2000 mg) by mouth ONE  HOUR before any dental procedures   aspirin 81 MG EC tablet Take 81 mg by mouth daily. Swallow whole.   brimonidine (ALPHAGAN) 0.2 % ophthalmic solution Place 1 drop into both eyes 3 (three) times daily.   carvedilol (COREG) 6.25 MG tablet Take 1 tablet (6.25 mg total) by mouth 2 (two) times daily. Patient needs an appointment for further refills. 2 nd attempt   Cholecalciferol (VITAMIN D3) 25 MCG  (1000 UT) CAPS Take 1 capsule by mouth daily.   diazepam (VALIUM) 5 MG tablet Take 5 mg by mouth as needed.   dorzolamide (TRUSOPT) 2 % ophthalmic solution Place 1 drop into both eyes 3 (three) times daily.   fluticasone (FLONASE) 50 MCG/ACT nasal spray Place 2 sprays into both nostrils as needed for congestion.   hydroxypropyl methylcellulose / hypromellose (ISOPTO TEARS / GONIOVISC) 2.5 % ophthalmic solution Place 1 drop into both eyes daily.   ibuprofen (ADVIL) 200 MG tablet Take 200 mg by mouth every 8 (eight) hours as needed for headache or moderate pain.   Latanoprostene Bunod (VYZULTA) 0.024 % SOLN Place 1 drop into both eyes at bedtime.   Multiple Vitamins-Minerals (ICAPS AREDS 2 PO) Take 1 capsule by mouth daily.   valsartan (DIOVAN) 40 MG tablet TAKE 1 TABLET BY MOUTH TWICE A DAY      EKGs/Labs/Other Studies Reviewed:    The following studies were reviewed today:  EKG Interpretation Date/Time:  Thursday November 17 2022 10:02:05 EDT Ventricular Rate:  64 PR Interval:  204 QRS Duration:  86 QT Interval:  396 QTC Calculation: 408 R Axis:   50  Text Interpretation: Normal sinus rhythm Low voltage QRS Cannot rule out Anteroseptal infarct (cited on or before 21-Aug-2020) Abnormal ECG When compared with ECG of 22-Aug-2020 03:31, ST no longer depressed in Lateral leads T wave inversion no longer evident in Lateral leads Confirmed by Norman Herrlich ([id number]) on 11/17/2022 11:49:05 AM         Recent Labs: No results found for requested labs within last 365 days.  Recent Lipid Panel    Component Value Date/Time   CHOL 164 12/17/2020 0942   TRIG 87 12/17/2020 0942   HDL 51 12/17/2020 0942   CHOLHDL 3.2 12/17/2020 0942   LDLCALC 97 12/17/2020 0942    Physical Exam:    VS:  BP (!) 150/70 (BP Location: Left Arm, Patient Position: Sitting, Cuff Size: Normal)   Pulse 79   Ht 5\' 2"  (1.575 m)   Wt 152 lb (68.9 kg)   SpO2 96%   BMI 27.80 kg/m     Wt Readings from Last 3  Encounters:  11/17/22 152 lb (68.9 kg)  08/20/21 144 lb (65.3 kg)  07/12/21 143 lb 9.6 oz (65.1 kg)     GEN:  Well nourished, well developed in no acute distress HEENT: Normal NECK: No JVD; No carotid bruits LYMPHATICS: No lymphadenopathy CARDIAC: RRR, no murmurs, rubs, gallops RESPIRATORY:  Clear to auscultation without rales, wheezing or rhonchi  ABDOMEN: Soft, non-tender, non-distended MUSCULOSKELETAL:  No edema; No deformity  SKIN: Warm and dry NEUROLOGIC:  Alert and oriented x 3 PSYCHIATRIC:  Normal affect    Signed, Norman Herrlich, MD  11/17/2022 9:45 AM    [facility]

## 2022-11-17 ENCOUNTER — Encounter: Payer: Self-pay | Admitting: Cardiology

## 2022-11-17 ENCOUNTER — Ambulatory Visit: Payer: Medicare Other | Attending: Cardiology | Admitting: Cardiology

## 2022-11-17 VITALS — BP 150/70 | HR 79 | Ht 62.0 in | Wt 152.0 lb

## 2022-11-17 DIAGNOSIS — I2510 Atherosclerotic heart disease of native coronary artery without angina pectoris: Secondary | ICD-10-CM | POA: Diagnosis not present

## 2022-11-17 DIAGNOSIS — I10 Essential (primary) hypertension: Secondary | ICD-10-CM

## 2022-11-17 DIAGNOSIS — Z952 Presence of prosthetic heart valve: Secondary | ICD-10-CM

## 2022-11-17 DIAGNOSIS — E7800 Pure hypercholesterolemia, unspecified: Secondary | ICD-10-CM | POA: Diagnosis not present

## 2022-11-17 MED ORDER — CARVEDILOL 6.25 MG PO TABS: 6.2500 mg | ORAL_TABLET | Freq: Two times a day (BID) | ORAL | 3 refills | Status: DC

## 2022-11-17 MED ORDER — VALSARTAN 40 MG PO TABS: 40.0000 mg | ORAL_TABLET | Freq: Two times a day (BID) | ORAL | 3 refills | Status: DC

## 2022-11-17 MED ORDER — EZETIMIBE 10 MG PO TABS: 5.0000 mg | ORAL_TABLET | Freq: Every day | ORAL | 3 refills | Status: DC

## 2022-11-17 NOTE — Patient Instructions (Signed)
Medication Instructions:  Your physician has recommended you make the following change in your medication:   START: Zetia 5 mg daily  *If you need a refill on your cardiac medications before your next appointment, please call your pharmacy*   Lab Work: None If you have labs (blood work) drawn today and your tests are completely normal, you will receive your results only by: MyChart Message (if you have MyChart) OR A paper copy in the mail If you have any lab test that is abnormal or we need to change your treatment, we will call you to review the results.   Testing/Procedures: Your physician has requested that you have an echocardiogram. Echocardiography is a painless test that uses sound waves to create images of your heart. It provides your doctor with information about the size and shape of your heart and how well your heart's chambers and valves are working. This procedure takes approximately one hour. There are no restrictions for this procedure. Please do NOT wear cologne, perfume, aftershave, or lotions (deodorant is allowed). Please arrive 15 minutes prior to your appointment time.    Follow-Up: At [facility], you and your health needs are our priority.  As part of our continuing mission to provide you with exceptional heart care, we have created designated Provider Care Teams.  These Care Teams include your primary Cardiologist (physician) and Advanced Practice Providers (APPs -  Physician Assistants and Nurse Practitioners) who all work together to provide you with the care you need, when you need it.  We recommend signing up for the patient portal called "MyChart".  Sign up information is provided on this After Visit Summary.  MyChart is used to connect with patients for Virtual Visits (Telemedicine).  Patients are able to view lab/test results, encounter notes, upcoming appointments, etc.  Non-urgent messages can be sent to your provider as well.   To learn more about  what you can do with MyChart, go to [email or web address].    Your next appointment:   1 year(s)  Provider:   Norman Herrlich, MD    Other Instructions None    [facility]  Tips to measure your blood pressure correctly  To determine whether you have hypertension, a medical professional will take a blood pressure reading. How you prepare for the test, the position of your arm, and other factors can change a blood pressure reading by 10% or more. That could be enough to hide high blood pressure, start you on a drug you don't really need, or lead your doctor to incorrectly adjust your medications. National and international guidelines offer specific instructions for measuring blood pressure. If a doctor, nurse, or medical assistant isn't doing it right, don't hesitate to ask him or her to get with the guidelines. Here's what you can do to ensure a correct reading:  Don't drink a caffeinated beverage or smoke during the 30 minutes before the test.  Sit quietly for five minutes before the test begins.  During the measurement, sit in a chair with your feet on the floor and your arm supported so your elbow is at about heart level.  The inflatable part of the cuff should completely cover at least 80% of your upper arm, and the cuff should be placed on bare skin, not over a shirt.  Don't talk during the measurement.  Have your blood pressure measured twice, with a brief break in between. If the readings are different by 5 points or more, have it done a third time. There are  times to break these rules. If you sometimes feel lightheaded when getting out of bed in the morning or when you stand after sitting, you should have your blood pressure checked while seated and then while standing to see if it falls from one position to the next. Because blood pressure varies throughout the day, your doctor will rarely diagnose hypertension on the basis of a single reading. Instead, he or she will want to confirm  the measurements on at least two occasions, usually within a few weeks of one another. The exception to this rule is if you have a blood pressure reading of 180/110 mm Hg or higher. A result this high usually calls for prompt treatment. It's also a good idea to have your blood pressure measured in both arms at least once, since the reading in one arm (usually the right) may be higher than that in the left. A 2014 study in The American Journal of Medicine of nearly 3,400 people found average arm- to-arm differences in systolic blood pressure of about 5 points. The higher number should be used to make treatment decisions. In 2017, new guidelines from the American Heart Association, the [employer] of Cardiology, and nine other health organizations lowered the diagnosis of high blood pressure to 130/80 mm Hg or higher for all adults. The guidelines also redefined the various blood pressure categories to now include normal, elevated, Stage 1 hypertension, Stage 2 hypertension, and hypertensive crisis (see "Blood pressure categories"). Blood pressure categories  Blood pressure category SYSTOLIC (upper number)  DIASTOLIC (lower number)  Normal Less than 120 mm Hg and Less than 80 mm Hg  Elevated 120-129 mm Hg and Less than 80 mm Hg  High blood pressure: Stage 1 hypertension 130-139 mm Hg or 80-89 mm Hg  High blood pressure: Stage 2 hypertension 140 mm Hg or higher or 90 mm Hg or higher  Hypertensive crisis (consult your doctor immediately) Higher than 180 mm Hg and/or Higher than 120 mm Hg  Source: American Heart Association and American Stroke Association. For more on getting your blood pressure under control, buy Controlling Your Blood Pressure, a Special Health Report from [facility].

## 2022-11-29 DIAGNOSIS — Z23 Encounter for immunization: Secondary | ICD-10-CM | POA: Diagnosis not present

## 2022-12-05 DIAGNOSIS — H353132 Nonexudative age-related macular degeneration, bilateral, intermediate dry stage: Secondary | ICD-10-CM | POA: Diagnosis not present

## 2022-12-05 DIAGNOSIS — H25812 Combined forms of age-related cataract, left eye: Secondary | ICD-10-CM | POA: Diagnosis not present

## 2022-12-05 DIAGNOSIS — H401133 Primary open-angle glaucoma, bilateral, severe stage: Secondary | ICD-10-CM | POA: Diagnosis not present

## 2022-12-05 DIAGNOSIS — H25813 Combined forms of age-related cataract, bilateral: Secondary | ICD-10-CM | POA: Diagnosis not present

## 2023-01-16 DIAGNOSIS — Z6828 Body mass index (BMI) 28.0-28.9, adult: Secondary | ICD-10-CM | POA: Diagnosis not present

## 2023-01-16 DIAGNOSIS — I10 Essential (primary) hypertension: Secondary | ICD-10-CM | POA: Diagnosis not present

## 2023-01-16 DIAGNOSIS — H9191 Unspecified hearing loss, right ear: Secondary | ICD-10-CM | POA: Diagnosis not present

## 2023-02-01 DIAGNOSIS — Z6827 Body mass index (BMI) 27.0-27.9, adult: Secondary | ICD-10-CM | POA: Diagnosis not present

## 2023-02-01 DIAGNOSIS — I10 Essential (primary) hypertension: Secondary | ICD-10-CM | POA: Diagnosis not present

## 2023-02-01 DIAGNOSIS — R42 Dizziness and giddiness: Secondary | ICD-10-CM | POA: Diagnosis not present

## 2023-02-01 DIAGNOSIS — F419 Anxiety disorder, unspecified: Secondary | ICD-10-CM | POA: Diagnosis not present

## 2023-03-09 DIAGNOSIS — H401133 Primary open-angle glaucoma, bilateral, severe stage: Secondary | ICD-10-CM | POA: Diagnosis not present

## 2023-03-20 DIAGNOSIS — Z6827 Body mass index (BMI) 27.0-27.9, adult: Secondary | ICD-10-CM | POA: Diagnosis not present

## 2023-03-20 DIAGNOSIS — H6090 Unspecified otitis externa, unspecified ear: Secondary | ICD-10-CM | POA: Diagnosis not present

## 2023-03-27 DIAGNOSIS — H401133 Primary open-angle glaucoma, bilateral, severe stage: Secondary | ICD-10-CM | POA: Diagnosis not present

## 2023-04-21 DIAGNOSIS — H26491 Other secondary cataract, right eye: Secondary | ICD-10-CM | POA: Diagnosis not present

## 2023-04-21 DIAGNOSIS — Z961 Presence of intraocular lens: Secondary | ICD-10-CM | POA: Diagnosis not present

## 2023-04-21 DIAGNOSIS — H401133 Primary open-angle glaucoma, bilateral, severe stage: Secondary | ICD-10-CM | POA: Diagnosis not present

## 2023-05-05 DIAGNOSIS — H6691 Otitis media, unspecified, right ear: Secondary | ICD-10-CM | POA: Diagnosis not present

## 2023-05-16 ENCOUNTER — Telehealth: Payer: Self-pay | Admitting: Cardiology

## 2023-05-16 ENCOUNTER — Other Ambulatory Visit: Payer: Self-pay | Admitting: Cardiology

## 2023-05-16 NOTE — Telephone Encounter (Signed)
 Pharmacy note stated that the patient had questions regarding her Carvedilol. Called patient and she told me that she is taking her 6.25 mg BID along with 3.125 mg BID that her PCP prescribed. Patient stated that her BP was low but when she stops taking the 3.125 mg her BP goes back up to being too high. I suggested she speak to a doctor since I could not advise on what to do. She has an appointment with Elliot Gurney on 05/18/2023.

## 2023-05-16 NOTE — Telephone Encounter (Signed)
 Left voicemail for patient to return call.

## 2023-05-16 NOTE — Telephone Encounter (Signed)
 Pt c/o medication issue:  1. Name of Medication:   valsartan (DIOVAN) 40 MG tablet  carvedilol (COREG) 6.25 MG tablet   2. How are you currently taking this medication (dosage and times per day)?   3. Are you having a reaction (difficulty breathing--STAT)?   4. What is your medication issue?   Patient stated her valsartan (DIOVAN) 40 MG tablet was doubled and her  carvedilol (COREG) 6.25 MG tablet medication was halved by her PCP.  Patient noted she developed an ear infection and had been having dizziness and stated she stopped taking the amlodipine Dr. Leonor Liv had also prescribed.  Patient is concerned her BP has increased to 175 over "something".  Patient noted she will be going to her dentist this morning and wants a call back between 1:00-2:00 pm this afternoon.

## 2023-05-17 ENCOUNTER — Encounter: Payer: Self-pay | Admitting: Cardiology

## 2023-05-17 DIAGNOSIS — H6691 Otitis media, unspecified, right ear: Secondary | ICD-10-CM | POA: Diagnosis not present

## 2023-05-17 NOTE — Telephone Encounter (Signed)
 Pt returning call/requesting c/b

## 2023-05-17 NOTE — Telephone Encounter (Signed)
 Spoke with pt who states she has an appointment tomorrow to discuss BP. No additional questions.

## 2023-05-17 NOTE — Progress Notes (Signed)
 " Cardiology Office Note:  .   Date:  05/18/2023  ID:  Stephanie Rosario, DOB Feb 09, 1941, MRN [id number] PCP: Stephanie Marcellus RAMAN, Stephanie Rosario  [facility] Providers Cardiologist:  Redell Leiter, Stephanie Rosario    History of Present Illness: .   Stephanie Rosario is a 83 y.o. female with a past medical history of cortication of the aorta repaired at age 15, nonobstructive CAD, hypertension, aortic stenosis s/p TAVR, OSA, dyslipidemia, carotid artery stenosis.  08/30/2021 carotid duplex right ICA moderate stenosis, left ICA mild stenosis 08/21/2020 TAVR 08/05/2020 cardiac cath nonobstructive CAD, mid RCA 50% stenosed, mid LAD 50% stenosed  Until 2022 she had been doing well, then developed progressive shortness of breath, fatigue, chest pain.  An echocardiogram revealed critical aortic stenosis >> structural heart team and underwent TAVR on 08/21/2020 with a 23 mm Edwards S3U.   Most recently evaluated by Dr. Leiter on 11/17/2022, she was stable from a cardiac perspective, was started on Zetia  and a repeat echocardiogram was arranged for surveillance of her TAVR and she was advised to follow-up in a year.  She presents today with concerns of abnormal blood pressure readings.  She has been dealing with a chronic ear infection for approximately 3 months, this been causing considerable pain, and her blood pressure has been harder to control.  She has been evaluated by her PCP on a few occasions for this, currently taken valsartan  160 mg twice daily, as well as 9.375 mg twice daily of Coreg . She is checking her BP frequently at home, has some anxiety surrounding checking her blood pressure. Her ophthalmologist has also advised her to keep her SBP > 115, so she is constantly trying to maintain certain parameters. Occasional dizziness, but she thinks this could be related to her ear issues. She denies chest pain, palpitations, dyspnea, pnd, orthopnea, n, v, syncope, edema, weight gain, or early satiety.   ROS: Review of Systems   Neurological:  Positive for dizziness.  All other systems reviewed and are negative.    Studies Reviewed: .        Cardiac Studies & Procedures   ______________________________________________________________________________________________ CARDIAC CATHETERIZATION  CARDIAC CATHETERIZATION 08/05/2020  Narrative  Prox RCA lesion is 30% stenosed.  Mid RCA lesion is 50% stenosed.  Mid Cx lesion is 30% stenosed.  Prox LAD lesion is 30% stenosed.  Mid LAD lesion is 50% stenosed.  1. Non-obstructive CAD 2. Severe aortic stenosis (mean gradient 64 mmHg, peak to peak gradient 110 mmHg, AVA 0.46 cm2).  Will continue workup for TAVR. We will arrange CT scans and then proceed with official surgical consultation.  Findings Coronary Findings Diagnostic  Dominance: Right  Left Anterior Descending Vessel is large. Prox LAD lesion is 30% stenosed. Mid LAD lesion is 50% stenosed.  Left Circumflex Vessel is large. Mid Cx lesion is 30% stenosed.  Right Coronary Artery Vessel is large. Prox RCA lesion is 30% stenosed. Mid RCA lesion is 50% stenosed.  Intervention  No interventions have been documented.     ECHOCARDIOGRAM  ECHOCARDIOGRAM COMPLETE 08/20/2021  Narrative ECHOCARDIOGRAM REPORT    Patient Name:   Tris Howell Date of Exam: 08/20/2021 Medical Rec #:  [id number]     Height:       62.0 in Accession #:    [id number]    Weight:       143.6 lb Date of Birth:  1940/09/08     BSA:          1.661 m Patient Age:    21 years  BP:           162/90 mmHg Patient Gender: F             HR:           84 bpm. Exam Location:  [address]  Procedure: 2D Echo, 3D Echo, Cardiac Doppler and Color Doppler  Indications:    Z95.2 TAVR  History:        Patient has prior history of Echocardiogram examinations, most recent 09/16/2020. CAD, Aortic Valve Disease, Signs/Symptoms:Shortness of Breath; Risk Factors:Hypertension, Family History of Coronary Artery Disease,  Dyslipidemia and Sleep Apnea. Aortic Stenosis status post TAVR (08/21/20, 23mm Sapien Prosthetic TAVR Valve), History of Coarctation Repair (at age 13).  Sonographer:    Heather Hawks RDCS Referring Phys: Stephanie OLP, S  IMPRESSIONS   1. Left ventricular ejection fraction, by estimation, is 70 to 75%. The left ventricle has hyperdynamic function. The left ventricle has no regional wall motion abnormalities. There is mild concentric left ventricular hypertrophy. Left ventricular diastolic parameters are consistent with Grade I diastolic dysfunction (impaired relaxation). 2. Right ventricular systolic function is normal. The right ventricular size is normal. There is normal pulmonary artery systolic pressure. 3. Left atrial size was moderately dilated. 4. Right atrial size was mild to moderately dilated. 5. The mitral valve is normal in structure. No evidence of mitral valve regurgitation. No evidence of mitral stenosis. 6. The aortic valve has been repaired/replaced. Aortic valve regurgitation is not visualized. No aortic stenosis is present. Echo findings are consistent with normal structure and function of the aortic valve prosthesis. Aortic valve area, by VTI measures 1.43 cm. Aortic valve mean gradient measures 10.5 mmHg. Aortic valve Vmax measures 2.20 m/s. 7. Aortic dilatation noted. There is mild dilatation of the ascending aorta, measuring 43 mm. 8. The inferior vena cava is normal in size with greater than 50% respiratory variability, suggesting right atrial pressure of 3 mmHg.  FINDINGS Left Ventricle: Left ventricular ejection fraction, by estimation, is 70 to 75%. The left ventricle has hyperdynamic function. The left ventricle has no regional wall motion abnormalities. The left ventricular internal cavity size was normal in size. There is mild concentric left ventricular hypertrophy. Left ventricular diastolic parameters are consistent with Grade I diastolic dysfunction  (impaired relaxation).  Right Ventricle: The right ventricular size is normal. No increase in right ventricular wall thickness. Right ventricular systolic function is normal. There is normal pulmonary artery systolic pressure. The tricuspid regurgitant velocity is 2.26 m/s, and with an assumed right atrial pressure of 3 mmHg, the estimated right ventricular systolic pressure is 23.4 mmHg.  Left Atrium: Left atrial size was moderately dilated.  Right Atrium: Right atrial size was mild to moderately dilated.  Pericardium: There is no evidence of pericardial effusion.  Mitral Valve: The mitral valve is normal in structure. Mild mitral annular calcification. No evidence of mitral valve regurgitation. No evidence of mitral valve stenosis.  Tricuspid Valve: The tricuspid valve is normal in structure. Tricuspid valve regurgitation is trivial. No evidence of tricuspid stenosis.  Aortic Valve: The aortic valve has been repaired/replaced. Aortic valve regurgitation is not visualized. No aortic stenosis is present. Aortic valve mean gradient measures 10.5 mmHg. Aortic valve peak gradient measures 19.4 mmHg. Aortic valve area, by VTI measures 1.43 cm. There is a Sapien prosthetic, stented (TAVR) valve present in the aortic position. Echo findings are consistent with normal structure and function of the aortic valve prosthesis.  Pulmonic Valve: The pulmonic valve was normal in structure. Pulmonic valve  regurgitation is mild. No evidence of pulmonic stenosis.  Aorta: The aortic root is normal in size and structure and aortic dilatation noted. Ascending aorta measurements are within normal limits for age when indexed to body surface area. There is mild dilatation of the ascending aorta, measuring 43 mm.  Venous: The inferior vena cava is normal in size with greater than 50% respiratory variability, suggesting right atrial pressure of 3 mmHg.  IAS/Shunts: No atrial level shunt detected by color flow  Doppler.   LEFT VENTRICLE PLAX 2D LVIDd:         4.00 cm   Diastology LVIDs:         2.00 cm   LV e' medial:    6.85 cm/s LV PW:         1.00 cm   LV E/e' medial:  9.5 LV IVS:        1.30 cm   LV e' lateral:   5.75 cm/s LVOT diam:     2.10 cm   LV E/e' lateral: 11.3 LV SV:         64 LV SV Index:   39 LVOT Area:     3.46 cm   RIGHT VENTRICLE             IVC RV Basal diam:  3.30 cm     IVC diam: 1.49 cm RV S prime:     12.90 cm/s TAPSE (M-mode): 2.2 cm  LEFT ATRIUM         Index LA diam:    3.60 cm 2.17 cm/m AORTIC VALVE AV Area (Vmax):    1.36 cm AV Area (Vmean):   1.34 cm AV Area (VTI):     1.43 cm AV Vmax:           220.00 cm/s AV Vmean:          153.500 cm/s AV VTI:            0.450 m AV Peak Grad:      19.4 mmHg AV Mean Grad:      10.5 mmHg LVOT Vmax:         86.20 cm/s LVOT Vmean:        59.500 cm/s LVOT VTI:          0.186 m LVOT/AV VTI ratio: 0.41  AORTA Ao Root diam: 3.50 cm Ao Asc diam:  4.30 cm  MITRAL VALVE                  TRICUSPID VALVE MV Area (PHT)  cm            TR Peak grad:   20.4 mmHg MV Decel Time: 239 msec       TR Vmax:        226.00 cm/s MV E velocity: 64.95 cm/s MV A velocity: 11350.00 cm/s  SHUNTS MV E/A ratio:  0.01           Systemic VTI:  0.19 m Systemic Diam: 2.10 cm  Toribio Fuel Stephanie Rosario Electronically signed by Toribio Fuel Stephanie Rosario Signature Date/Time: 08/20/2021/7:15:12 PM    Final      CT SCANS  CT CORONARY MORPH W/CTA COR W/SCORE 08/12/2020  Addendum 08/14/2020  3:22 PM ADDENDUM REPORT: 08/14/2020 15:20  CLINICAL DATA:  Aortic stenosis  History of distant aortic procedure NOS  EXAM: Cardiac TAVR CT  TECHNIQUE: The patient was scanned on a Siemens Force 192 slice scanner. A 120 kV retrospective scan was triggered in the descending thoracic aorta at 111  HU's. Gantry rotation speed was 270 msecs and collimation was .9 mm. No beta blockade or nitro were given. The 3D data set was reconstructed in 5% intervals  of the R-R cycle. Systolic and diastolic phases were analyzed on a dedicated work station using MPR, MIP and VRT modes. The patient received 95 cc of contrast.  FINDINGS: Aortic Valve: Severely thickened aortic valve with heavy calcification and reduced excursion the planimeter valve area is 0.897 Sq cm consistent with severe aortic stenosis  Number of leaflets: There is calcific fusion: valve appears functionally bicuspid  LVOT calcification: None  Annular calcification: Minimal  Aortic Valve Calcium Score: 4031  Prosthetic Valve: NA  Mitral Valve: No MAC  LV: Left ventricular function is normal with estimated LVEF 60-65%.  Aortic Annulus Measurements  Major annulus diameter: 27 mm  Minor annulus diameter: 21 mm  Annular perimeter: 4.34  cm2  Annular area: 76 mm  Aortic Root Measurements-  Sinus of Valsalva perimeter: 102 mm  Sinotubular Junction: 30 mm  Ascending Thoracic Aorta: 40 mm  Aortic Arch: 24 mm  Descending Thoracic Aorta: 14 mm X 14 mm at narrowest that reconstitutes to 19 mm below narrowing (< 50% change)  Descending aortic atherosclerosis noted.  No evidence of aortic collateral flow or post surgical changes  Sinus of Valsalva Measurements:  Right coronary cusp width: 29 mm  Left coronary cusp width: 30 mm  Non coronary cusp width: 32 mm  Coronary Artery Height above Annulus:  Left Main: 11.6 mm  Right Coronary: 9.6  mm  Coronary Calcium Score:  Left main: 10  Left anterior descending artery: 132  Left circumflex artery: 36  Right coronary artery: 106  Total: 284  Percentile: 69th for age, sex, and race matched control.  Optimum Fluoroscopic Angle for Delivery: LAO 1, CAU 4 (Anterior View)  Valves for structural team consideration: 29 mm CoreValve; 26 mm Edwards Sapien  IMPRESSION: 1. Severe Aortic stenosis. Findings pertinent to TAVR procedure are detailed above.  2. Descending aortic narrow as above; no post  surgical changes noted.  3. Coronary calcium score of 287. This was 69th percentile for age, sex, and race matched control.  4.  Mild aortic aneurysm: 40 mm.  RECOMMENDATIONS:  Coronary artery calcium (CAC) score is a strong predictor of incident coronary heart disease (CHD) and provides predictive information beyond traditional risk factors. CAC scoring is reasonable to use in the decision to withhold, postpone, or initiate statin therapy in intermediate-risk or selected borderline-risk asymptomatic adults (age 60-75 years and LDL-C >=70 to <190 mg/dL) who do not have diabetes or established atherosclerotic cardiovascular disease (ASCVD).* In intermediate-risk (10-year ASCVD risk >=7.5% to <20%) adults or selected borderline-risk (10-year ASCVD risk >=5% to <7.5%) adults in whom a CAC score is measured for the purpose of making a treatment decision the following recommendations have been made:  If CAC = 0, it is reasonable to withhold statin therapy and reassess in 5 to 10 years, as long as higher risk conditions are absent (diabetes mellitus, family history of premature CHD in first degree relatives (males <55 years; females <65 years), cigarette smoking, LDL >=190 mg/dL or other independent risk factors).  If CAC is 1 to 99, it is reasonable to initiate statin therapy for patients >=90 years of age.  If CAC is >=100 or >=75th percentile, it is reasonable to initiate statin therapy at any age.  Cardiology referral should be considered for patients with CAC scores >=400 or >=75th percentile.  *2018 AHA/ACC/AACVPR/AAPA/ABC/ACPM/ADA/AGS/APhA/ASPC/NLA/PCNA Guideline on  the Management of Blood Cholesterol: A Report of the [employer] of Cardiology/American Heart Association Task Force on Clinical Practice Guidelines. J Am Coll Cardiol. 2019;73(24):3168-3209.  Mahesh  Chandrasekhar   Electronically Signed By: Stanly Leavens Stephanie Rosario On: 08/14/2020  15:20  Narrative EXAM: OVER-READ INTERPRETATION  CT CHEST  The following report is an over-read performed by radiologist Dr. Toribio Aye of [facility] on 08/12/2020. This over-read does not include interpretation of cardiac or coronary anatomy or pathology. The coronary calcium score/coronary CTA interpretation by the cardiologist is attached.  COMPARISON:  None.  FINDINGS: Extracardiac findings will be described separately under dictation for contemporaneously obtained CTA chest, abdomen and pelvis.  IMPRESSION: Please see separate dictation for contemporaneously obtained CTA chest, abdomen and pelvis dated 08/12/2020 for full description of relevant extracardiac findings.  Electronically Signed: By: Toribio Aye M.D. On: 08/12/2020 11:43     ______________________________________________________________________________________________      Risk Assessment/Calculations:             Physical Exam:   VS:  BP 138/76   Pulse 72   Ht 5' 2 (1.575 m)   Wt 151 lb 9.6 oz (68.8 kg)   SpO2 98%   BMI 27.73 kg/m    Wt Readings from Last 3 Encounters:  05/18/23 151 lb 9.6 oz (68.8 kg)  11/17/22 152 lb (68.9 kg)  08/20/21 144 lb (65.3 kg)    GEN: Well nourished, well developed in no acute distress NECK: No JVD; No carotid bruits CARDIAC: RRR, no murmurs, rubs, gallops RESPIRATORY:  Clear to auscultation without rales, wheezing or rhonchi  ABDOMEN: Soft, non-tender, non-distended EXTREMITIES:  No edema; No deformity   ASSESSMENT AND PLAN: .   Hypertension -blood pressure in office is controlled 138/76, currently on valsartan  160 mg twice daily, as well as Coreg  9.375 twice daily.  She is trying very hard to keep her blood pressure within parameters that have been given to her by her ophthalmologist and also our office.  I do think she is checking her blood pressure too frequently.  Will give her one of our blood pressure logs and reviewed how to check  her blood pressure.  She is concerned that her ear infections are causing an elevation in her blood pressure and what will happen to her blood pressure as this issue is hopefully resolved.  I advised her to keep checking her blood pressure, if needed she can drop the 3.125 mg Coreg  she noticed her blood pressure dropping.  Aortic stenosis -repeat echocardiogram arranged for her greens were office around September of this year for surveillance.  She does have SBE as needed.  CAD -nonobstructive, Stable with no anginal symptoms. No indication for ischemic evaluation.  Continue aspirin  81 mg daily, continue Coreg , continue Zetia  10 mg daily.  Carotid artery stenosis - carotid duplex right ICA moderate stenosis, left ICA mild stenosis. Continue ASA 81 mg daily. Continue Zetia  10 mg daily.         Dispo:  BP log x 2 weeks, if BP starts trending low she can drop the 3.125 mg of carvedilol . Keep recall with Dr. Monetta.   Signed, Delon JAYSON Hoover, NP  "

## 2023-05-18 ENCOUNTER — Encounter: Payer: Self-pay | Admitting: Cardiology

## 2023-05-18 ENCOUNTER — Ambulatory Visit: Attending: Cardiology | Admitting: Cardiology

## 2023-05-18 VITALS — BP 138/76 | HR 72 | Ht 62.0 in | Wt 151.6 lb

## 2023-05-18 DIAGNOSIS — E7800 Pure hypercholesterolemia, unspecified: Secondary | ICD-10-CM

## 2023-05-18 DIAGNOSIS — Z952 Presence of prosthetic heart valve: Secondary | ICD-10-CM

## 2023-05-18 DIAGNOSIS — I6523 Occlusion and stenosis of bilateral carotid arteries: Secondary | ICD-10-CM | POA: Diagnosis not present

## 2023-05-18 DIAGNOSIS — I10 Essential (primary) hypertension: Secondary | ICD-10-CM | POA: Diagnosis not present

## 2023-05-18 DIAGNOSIS — I2510 Atherosclerotic heart disease of native coronary artery without angina pectoris: Secondary | ICD-10-CM | POA: Diagnosis not present

## 2023-05-18 NOTE — Patient Instructions (Signed)
 Medication Instructions:  Your physician recommends that you continue on your current medications as directed. Please refer to the Current Medication list given to you today.  *If you need a refill on your cardiac medications before your next appointment, please call your pharmacy*   Lab Work: NONE If you have labs (blood work) drawn today and your tests are completely normal, you will receive your results only by: MyChart Message (if you have MyChart) OR A paper copy in the mail If you have any lab test that is abnormal or we need to change your treatment, we will call you to review the results.   Testing/Procedures: NONE   Follow-Up: At [facility], you and your health needs are our priority.  As part of our continuing mission to provide you with exceptional heart care, we have created designated Provider Care Teams.  These Care Teams include your primary Cardiologist (physician) and Advanced Practice Providers (APPs -  Physician Assistants and Nurse Practitioners) who all work together to provide you with the care you need, when you need it.  We recommend signing up for the patient portal called "MyChart".  Sign up information is provided on this After Visit Summary.  MyChart is used to connect with patients for Virtual Visits (Telemedicine).  Patients are able to view lab/test results, encounter notes, upcoming appointments, etc.  Non-urgent messages can be sent to your provider as well.   To learn more about what you can do with MyChart, go to [email or web address].    Your next appointment:   6 month(s)  Provider:   Norman Herrlich, MD    Other Instructions If BP drops below 90/60 you can hold your Coreg that day Can take over the counter Flonase as directed Dr. Hulen Shouts nurse: Gerlene Burdock [phone] or CMA [place] [phone] Check and record BP two times daily, once in the morning 1-2 hours after taking medication and once in the evening before dinner. You can drop  off log at our office or send it through MyChart.

## 2023-05-25 DIAGNOSIS — H26491 Other secondary cataract, right eye: Secondary | ICD-10-CM | POA: Diagnosis not present

## 2023-07-03 DIAGNOSIS — H02882 Meibomian gland dysfunction right lower eyelid: Secondary | ICD-10-CM | POA: Diagnosis not present

## 2023-07-03 DIAGNOSIS — H26492 Other secondary cataract, left eye: Secondary | ICD-10-CM | POA: Diagnosis not present

## 2023-07-03 DIAGNOSIS — H401133 Primary open-angle glaucoma, bilateral, severe stage: Secondary | ICD-10-CM | POA: Diagnosis not present

## 2023-07-03 DIAGNOSIS — Z961 Presence of intraocular lens: Secondary | ICD-10-CM | POA: Diagnosis not present

## 2023-07-11 DIAGNOSIS — H6121 Impacted cerumen, right ear: Secondary | ICD-10-CM | POA: Diagnosis not present

## 2023-09-27 DIAGNOSIS — E663 Overweight: Secondary | ICD-10-CM | POA: Diagnosis not present

## 2023-09-27 DIAGNOSIS — R350 Frequency of micturition: Secondary | ICD-10-CM | POA: Diagnosis not present

## 2023-09-27 DIAGNOSIS — Z6826 Body mass index (BMI) 26.0-26.9, adult: Secondary | ICD-10-CM | POA: Diagnosis not present

## 2023-09-30 DIAGNOSIS — N3001 Acute cystitis with hematuria: Secondary | ICD-10-CM | POA: Diagnosis not present

## 2023-09-30 DIAGNOSIS — R110 Nausea: Secondary | ICD-10-CM | POA: Diagnosis not present

## 2023-10-09 DIAGNOSIS — J309 Allergic rhinitis, unspecified: Secondary | ICD-10-CM | POA: Diagnosis not present

## 2023-10-09 DIAGNOSIS — M79671 Pain in right foot: Secondary | ICD-10-CM | POA: Diagnosis not present

## 2023-10-09 DIAGNOSIS — M545 Low back pain, unspecified: Secondary | ICD-10-CM | POA: Diagnosis not present

## 2023-10-11 DIAGNOSIS — Z23 Encounter for immunization: Secondary | ICD-10-CM | POA: Diagnosis not present

## 2023-10-12 DIAGNOSIS — N200 Calculus of kidney: Secondary | ICD-10-CM | POA: Diagnosis not present

## 2023-10-12 DIAGNOSIS — R310 Gross hematuria: Secondary | ICD-10-CM | POA: Diagnosis not present

## 2023-10-12 DIAGNOSIS — N3001 Acute cystitis with hematuria: Secondary | ICD-10-CM | POA: Diagnosis not present

## 2023-10-12 DIAGNOSIS — I10 Essential (primary) hypertension: Secondary | ICD-10-CM | POA: Diagnosis not present

## 2023-10-12 DIAGNOSIS — R319 Hematuria, unspecified: Secondary | ICD-10-CM | POA: Diagnosis not present

## 2023-10-12 LAB — LAB REPORT - SCANNED: EGFR: 63

## 2023-10-13 DIAGNOSIS — I10 Essential (primary) hypertension: Secondary | ICD-10-CM | POA: Diagnosis not present

## 2023-10-13 DIAGNOSIS — N3001 Acute cystitis with hematuria: Secondary | ICD-10-CM | POA: Diagnosis not present

## 2023-10-13 DIAGNOSIS — R319 Hematuria, unspecified: Secondary | ICD-10-CM | POA: Diagnosis not present

## 2023-10-26 DIAGNOSIS — N200 Calculus of kidney: Secondary | ICD-10-CM | POA: Diagnosis not present

## 2023-10-26 DIAGNOSIS — Z0000 Encounter for general adult medical examination without abnormal findings: Secondary | ICD-10-CM | POA: Diagnosis not present

## 2023-10-26 DIAGNOSIS — Z1339 Encounter for screening examination for other mental health and behavioral disorders: Secondary | ICD-10-CM | POA: Diagnosis not present

## 2023-10-26 DIAGNOSIS — Z1331 Encounter for screening for depression: Secondary | ICD-10-CM | POA: Diagnosis not present

## 2023-10-26 DIAGNOSIS — Z6826 Body mass index (BMI) 26.0-26.9, adult: Secondary | ICD-10-CM | POA: Diagnosis not present

## 2023-11-09 ENCOUNTER — Other Ambulatory Visit: Payer: Self-pay | Admitting: Cardiology

## 2023-11-13 DIAGNOSIS — H02882 Meibomian gland dysfunction right lower eyelid: Secondary | ICD-10-CM | POA: Diagnosis not present

## 2023-11-13 DIAGNOSIS — Z961 Presence of intraocular lens: Secondary | ICD-10-CM | POA: Diagnosis not present

## 2023-11-13 DIAGNOSIS — H353132 Nonexudative age-related macular degeneration, bilateral, intermediate dry stage: Secondary | ICD-10-CM | POA: Diagnosis not present

## 2023-11-13 DIAGNOSIS — H401133 Primary open-angle glaucoma, bilateral, severe stage: Secondary | ICD-10-CM | POA: Diagnosis not present

## 2023-11-13 DIAGNOSIS — H26492 Other secondary cataract, left eye: Secondary | ICD-10-CM | POA: Diagnosis not present

## 2023-11-16 ENCOUNTER — Ambulatory Visit (HOSPITAL_COMMUNITY)
Admission: RE | Admit: 2023-11-16 | Discharge: 2023-11-16 | Disposition: A | Discharge status: Home / Self Care | Source: Ambulatory Visit | Attending: Internal Medicine | Admitting: Internal Medicine

## 2023-11-16 DIAGNOSIS — E7800 Pure hypercholesterolemia, unspecified: Secondary | ICD-10-CM | POA: Insufficient documentation

## 2023-11-16 DIAGNOSIS — I10 Essential (primary) hypertension: Secondary | ICD-10-CM | POA: Insufficient documentation

## 2023-11-16 DIAGNOSIS — Z952 Presence of prosthetic heart valve: Secondary | ICD-10-CM | POA: Diagnosis not present

## 2023-11-16 DIAGNOSIS — I2510 Atherosclerotic heart disease of native coronary artery without angina pectoris: Secondary | ICD-10-CM | POA: Diagnosis not present

## 2023-11-16 LAB — ECHOCARDIOGRAM COMPLETE
AV Mean grad: 8.4 mmHg
AV Peak grad: 14.7 mmHg
Ao pk vel: 1.91 m/s
Area-P 1/2: 4.12 cm2
Est EF: >75
S' Lateral: 2 cm

## 2023-11-17 ENCOUNTER — Encounter: Payer: Self-pay | Admitting: Cardiology

## 2023-11-28 DIAGNOSIS — I6529 Occlusion and stenosis of unspecified carotid artery: Secondary | ICD-10-CM | POA: Insufficient documentation

## 2023-11-29 ENCOUNTER — Ambulatory Visit: Attending: Cardiology | Admitting: Cardiology

## 2023-11-29 VITALS — BP 178/76 | HR 70 | Ht 62.0 in | Wt 148.0 lb

## 2023-11-29 DIAGNOSIS — Z952 Presence of prosthetic heart valve: Secondary | ICD-10-CM | POA: Insufficient documentation

## 2023-11-29 DIAGNOSIS — I6523 Occlusion and stenosis of bilateral carotid arteries: Secondary | ICD-10-CM | POA: Insufficient documentation

## 2023-11-29 DIAGNOSIS — I2510 Atherosclerotic heart disease of native coronary artery without angina pectoris: Secondary | ICD-10-CM | POA: Insufficient documentation

## 2023-11-29 DIAGNOSIS — I10 Essential (primary) hypertension: Secondary | ICD-10-CM | POA: Diagnosis not present

## 2023-11-29 DIAGNOSIS — E7800 Pure hypercholesterolemia, unspecified: Secondary | ICD-10-CM | POA: Diagnosis not present

## 2023-11-29 MED ORDER — VALSARTAN 80 MG PO TABS: 160.0000 mg | ORAL_TABLET | Freq: Two times a day (BID) | ORAL | 3 refills | Status: AC

## 2023-11-29 MED ORDER — VALSARTAN 160 MG PO TABS: 160.0000 mg | ORAL_TABLET | Freq: Two times a day (BID) | ORAL | 3 refills | Status: DC

## 2023-11-29 NOTE — Progress Notes (Signed)
 Cardiology Office Note:    Date:  11/29/2023   ID:  Stephanie Rosario, DOB 05-11-1940, MRN [id number]  PCP:  Ina Marcellus RAMAN, MD  Cardiologist:  Redell Leiter, MD    Referring MD: Ina Marcellus RAMAN, MD    ASSESSMENT:    1. S/P TAVR (transcatheter aortic valve replacement)   2. Mild CAD   3. Benign essential hypertension   4. Pure hypercholesterolemia   5. Bilateral carotid artery stenosis    PLAN:    In order of problems listed above:  Stable good durable long-term result will repeat an echo in 2 years but see me in yearly follow-up She is intolerant of statins continue Zetia  along with low-dose aspirin  and antibiotic prophylaxis with prosthetic valve Well-controlled with ambulatory measurements if systolics are less than 110 commonly she will reduce her ARB dose 50% Stable carotids Continue her current lipid-lowering treatment labs are followed up with her PCP   Next appointment: 1 year   Medication Adjustments/Labs and Tests Ordered: Current medicines are reviewed at length with the patient today.  Concerns regarding medicines are outlined above.  Orders Placed This Encounter  Procedures   Comp Met (CMET)   Lipid Profile   EKG 12-Lead   ECHOCARDIOGRAM COMPLETE   Meds ordered this encounter  Medications   valsartan  (DIOVAN ) 160 MG tablet    Sig: Take 1 tablet (160 mg total) by mouth 2 (two) times daily. If SBP is  less than 110, then reduce the Valsartan  to 80 mg two times daily    Dispense:  180 tablet    Refill:  3     History of Present Illness:    Stephanie Rosario is a 83 y.o. female with a hx of congenital heart disease with surgical repair coarctation of the aorta  hypertension hyperlipidemia carotid disease sleep apnea mild nonobstructive CAD and symptomatic aortic stenosis with TAVR 08/21/2020 last seen 07/12/2021.  Compliance with diet, lifestyle and medications: Yes  Echocardiogram 11/15/2020 showed normal function TAVR trivial regurgitation.  Systolic function  normal EF greater than 75% with moderate LVH.  Carotid duplex June 2023 showed mild to moderate right ICA stenosis 40 to 59%.  She had a urinary tract infection recently that caused her to stop her regular exercise program.  Prior to that her blood pressure is running in the 110-120/60-70 range since then more 120-130 systolic Reviewed the results of her TAVR echocardiogram She is not having cardiovascular symptoms of exercise tolerance edema shortness of breath chest pain palpitation or syncope She is on nonstatin lipid-lowering treatment Zetia  Past Medical History:  Diagnosis Date   Aortic stenosis    Benign essential hypertension    Benign paroxysmal positional vertigo    Carotid artery stenosis    History of nephrolithiasis    Nuclear sclerotic cataract of both eyes 12/30/2013   Primary open angle glaucoma of both eyes, severe stage 12/30/2013   Pure hypercholesterolemia    Severe aortic stenosis    Shortness of breath 07/24/2020   Sleep apnea    CPAP    Current Medications: Current Meds  Medication Sig   valsartan  (DIOVAN ) 160 MG tablet Take 1 tablet (160 mg total) by mouth 2 (two) times daily. If SBP is  less than 110, then reduce the Valsartan  to 80 mg two times daily   [DISCONTINUED] amLODipine (NORVASC) 5 MG tablet Take 5 mg by mouth at bedtime.   [DISCONTINUED] fosfomycin (MONUROL) 3 g PACK Take 3 g by mouth.   [DISCONTINUED] ondansetron  (ZOFRAN ) 4 MG  tablet Take 4 mg by mouth every 8 (eight) hours as needed.   [DISCONTINUED] tamsulosin (FLOMAX) 0.4 MG CAPS capsule Take 0.4 mg by mouth daily.      EKGs/Labs/Other Studies Reviewed:    The following studies were reviewed today:  Cardiac Studies & Procedures   ______________________________________________________________________________________________ CARDIAC CATHETERIZATION  CARDIAC CATHETERIZATION 08/05/2020  Conclusion  Prox RCA lesion is 30% stenosed.  Mid RCA lesion is 50% stenosed.  Mid Cx lesion is 30%  stenosed.  Prox LAD lesion is 30% stenosed.  Mid LAD lesion is 50% stenosed.  1. Non-obstructive CAD 2. Severe aortic stenosis (mean gradient 64 mmHg, peak to peak gradient 110 mmHg, AVA 0.46 cm2).  Will continue workup for TAVR. We will arrange CT scans and then proceed with official surgical consultation.  Findings Coronary Findings Diagnostic  Dominance: Right  Left Anterior Descending Vessel is large. Prox LAD lesion is 30% stenosed. Mid LAD lesion is 50% stenosed.  Left Circumflex Vessel is large. Mid Cx lesion is 30% stenosed.  Right Coronary Artery Vessel is large. Prox RCA lesion is 30% stenosed. Mid RCA lesion is 50% stenosed.  Intervention  No interventions have been documented.     ECHOCARDIOGRAM  ECHOCARDIOGRAM COMPLETE 11/16/2023  Narrative ECHOCARDIOGRAM REPORT    Patient Name:   Stephanie Rosario Date of Exam: 11/16/2023 Medical Rec #:  [id number]     Height:       62.0 in Accession #:    [id number]    Weight:       151.6 lb Date of Birth:  1941/01/31     BSA:          1.699 m Patient Age:    83 years      BP:           138/76 mmHg Patient Gender: F             HR:           90 bpm. Exam Location:  [address]  Procedure: 2D Echo, Cardiac Doppler and Color Doppler (Both Spectral and Color Flow Doppler were utilized during procedure).  Indications:    Z95.2 TAVR  History:        Patient has prior history of Echocardiogram examinations, most recent 08/20/2021. TAVR -23 mm Sapien ( 08/21/20). Coarctation Repair-83 years old., Signs/Symptoms:Shortness of Breath; Risk Factors:Hypertension and Dyslipidemia. Aortic Valve: 23 mm Sapien prosthetic, stented (TAVR) valve is present in the aortic position. Procedure Date: 08/21/2020.  Sonographer:    Carl Coma RDCS Referring Phys: [id number] Oanh Devivo J Londen Lorge  IMPRESSIONS   1. Left ventricular ejection fraction, by estimation, is >75%. The left ventricle has hyperdynamic function. The left  ventricle has no regional wall motion abnormalities. There is moderate concentric left ventricular hypertrophy. Left ventricular diastolic parameters are consistent with Grade I diastolic dysfunction (impaired relaxation). 2. Right ventricular systolic function is normal. The right ventricular size is normal. 3. The mitral valve is normal in structure. No evidence of mitral valve regurgitation. Echo findings are consistent with normal structure and function of the mitral valve prosthesis. 4. The aortic valve has been repaired/replaced. Aortic valve regurgitation is trivial. Aortic valve sclerosis is present, with no evidence of aortic valve stenosis. There is a 23 mm Sapien prosthetic (TAVR) valve present in the aortic position. Procedure Date: 08/21/2020. Aortic valve mean gradient measures 8.4 mmHg. Aortic valve Vmax measures 1.91 m/s. 5. Prior coarctation repair - no stenosis. Aortic dilatation noted. There is mild dilatation of the ascending aorta, measuring 42  mm. 6. The inferior vena cava is normal in size with greater than 50% respiratory variability, suggesting right atrial pressure of 3 mmHg.  Comparison(s): Changes from prior study are noted. 08/20/2021: LVEF 70-75%, TAVR MG 15 mmHg.  FINDINGS Left Ventricle: Left ventricular ejection fraction, by estimation, is >75%. The left ventricle has hyperdynamic function. The left ventricle has no regional wall motion abnormalities. The left ventricular internal cavity size was normal in size. There is moderate concentric left ventricular hypertrophy. Left ventricular diastolic parameters are consistent with Grade I diastolic dysfunction (impaired relaxation). Indeterminate filling pressures.  Right Ventricle: The right ventricular size is normal. No increase in right ventricular wall thickness. Right ventricular systolic function is normal.  Left Atrium: Left atrial size was normal in size.  Right Atrium: Right atrial size was normal in  size.  Pericardium: There is no evidence of pericardial effusion.  Mitral Valve: The mitral valve is normal in structure. No evidence of mitral valve regurgitation. Echo findings are consistent with normal structure and function of the mitral valve prosthesis.  Tricuspid Valve: The tricuspid valve is grossly normal. Tricuspid valve regurgitation is trivial.  The aortic valve has been repaired/replaced. Aortic valve regurgitation is trivial. Aortic valve sclerosis is present, with no evidence of aortic valve stenosis. There is a 23 mm Sapien prosthetic, stented (TAVR) valve present in the aortic position. Procedure Date: 08/21/2020. Pulmonic Valve: The pulmonic valve was normal in structure. Pulmonic valve regurgitation is not visualized.  Aorta: Prior coarctation repair - no stenosis. Aortic dilatation noted. There is mild dilatation of the ascending aorta, measuring 42 mm.  Venous: The inferior vena cava is normal in size with greater than 50% respiratory variability, suggesting right atrial pressure of 3 mmHg.  IAS/Shunts: No atrial level shunt detected by color flow Doppler.   LEFT VENTRICLE PLAX 2D LVIDd:         4.00 cm Diastology LVIDs:         2.00 cm LV e' medial:    5.40 cm/s LV PW:         1.20 cm LV E/e' medial:  13.0 LV IVS:        1.20 cm LV e' lateral:   4.68 cm/s LV E/e' lateral: 15.0   RIGHT VENTRICLE             IVC RV Basal diam:  3.00 cm     IVC diam: 1.70 cm RV S prime:     12.80 cm/s TAPSE (M-mode): 2.8 cm  LEFT ATRIUM             Index        RIGHT ATRIUM          Index LA diam:        3.70 cm 2.18 cm/m   RA Area:     8.78 cm LA Vol (A2C):   41.5 ml 24.42 ml/m  RA Volume:   16.40 ml 9.65 ml/m LA Vol (A4C):   33.8 ml 19.89 ml/m LA Biplane Vol: 38.1 ml 22.42 ml/m AORTIC VALVE AV Vmax:           191.40 cm/s AV Vmean:          136.000 cm/s AV VTI:            0.367 m AV Peak Grad:      14.7 mmHg AV Mean Grad:      8.4 mmHg LVOT Vmax:         93.37  cm/s LVOT Vmean:  63.533 cm/s LVOT VTI:          0.180 m LVOT/AV VTI ratio: 0.49  AORTA Ao Root diam: 3.50 cm Ao Asc diam:  4.20 cm  MITRAL VALVE MV Area (PHT): 4.12 cm     SHUNTS MV Decel Time: 184 msec     Systemic VTI: 0.18 m MV E velocity: 70.27 cm/s MV A velocity: 125.00 cm/s MV E/A ratio:  0.56  Vinie Maxcy MD Electronically signed by Vinie Maxcy MD Signature Date/Time: 11/16/2023/3:10:10 PM    Final      CT SCANS  CT CORONARY MORPH W/CTA COR W/SCORE 08/12/2020  Addendum 08/14/2020  3:22 PM ADDENDUM REPORT: 08/14/2020 15:20  CLINICAL DATA:  Aortic stenosis  History of distant aortic procedure NOS  EXAM: Cardiac TAVR CT  TECHNIQUE: The patient was scanned on a Siemens Force 192 slice scanner. A 120 kV retrospective scan was triggered in the descending thoracic aorta at 111 HU's. Gantry rotation speed was 270 msecs and collimation was .9 mm. No beta blockade or nitro were given. The 3D data set was reconstructed in 5% intervals of the R-R cycle. Systolic and diastolic phases were analyzed on a dedicated work station using MPR, MIP and VRT modes. The patient received 95 cc of contrast.  FINDINGS: Aortic Valve: Severely thickened aortic valve with heavy calcification and reduced excursion the planimeter valve area is 0.897 Sq cm consistent with severe aortic stenosis  Number of leaflets: There is calcific fusion: valve appears functionally bicuspid  LVOT calcification: None  Annular calcification: Minimal  Aortic Valve Calcium Score: 4031  Prosthetic Valve: NA  Mitral Valve: No MAC  LV: Left ventricular function is normal with estimated LVEF 60-65%.  Aortic Annulus Measurements  Major annulus diameter: 27 mm  Minor annulus diameter: 21 mm  Annular perimeter: 4.34  cm2  Annular area: 76 mm  Aortic Root Measurements-  Sinus of Valsalva perimeter: 102 mm  Sinotubular Junction: 30 mm  Ascending Thoracic Aorta: 40 mm  Aortic  Arch: 24 mm  Descending Thoracic Aorta: 14 mm X 14 mm at narrowest that reconstitutes to 19 mm below narrowing (< 50% change)  Descending aortic atherosclerosis noted.  No evidence of aortic collateral flow or post surgical changes  Sinus of Valsalva Measurements:  Right coronary cusp width: 29 mm  Left coronary cusp width: 30 mm  Non coronary cusp width: 32 mm  Coronary Artery Height above Annulus:  Left Main: 11.6 mm  Right Coronary: 9.6  mm  Coronary Calcium Score:  Left main: 10  Left anterior descending artery: 132  Left circumflex artery: 36  Right coronary artery: 106  Total: 284  Percentile: 69th for age, sex, and race matched control.  Optimum Fluoroscopic Angle for Delivery: LAO 1, CAU 4 (Anterior View)  Valves for structural team consideration: 29 mm CoreValve; 26 mm Edwards Sapien  IMPRESSION: 1. Severe Aortic stenosis. Findings pertinent to TAVR procedure are detailed above.  2. Descending aortic narrow as above; no post surgical changes noted.  3. Coronary calcium score of 287. This was 69th percentile for age, sex, and race matched control.  4.  Mild aortic aneurysm: 40 mm.  RECOMMENDATIONS:  Coronary artery calcium (CAC) score is a strong predictor of incident coronary heart disease (CHD) and provides predictive information beyond traditional risk factors. CAC scoring is reasonable to use in the decision to withhold, postpone, or initiate statin therapy in intermediate-risk or selected borderline-risk asymptomatic adults (age 65-75 years and LDL-C >=70 to <190 mg/dL) who do not  have diabetes or established atherosclerotic cardiovascular disease (ASCVD).* In intermediate-risk (10-year ASCVD risk >=7.5% to <20%) adults or selected borderline-risk (10-year ASCVD risk >=5% to <7.5%) adults in whom a CAC score is measured for the purpose of making a treatment decision the following recommendations have been made:  If CAC = 0, it is  reasonable to withhold statin therapy and reassess in 5 to 10 years, as long as higher risk conditions are absent (diabetes mellitus, family history of premature CHD in first degree relatives (males <55 years; females <65 years), cigarette smoking, LDL >=190 mg/dL or other independent risk factors).  If CAC is 1 to 99, it is reasonable to initiate statin therapy for patients >=10 years of age.  If CAC is >=100 or >=75th percentile, it is reasonable to initiate statin therapy at any age.  Cardiology referral should be considered for patients with CAC scores >=400 or >=75th percentile.  *2018 AHA/ACC/AACVPR/AAPA/ABC/ACPM/ADA/AGS/APhA/ASPC/NLA/PCNA Guideline on the Management of Blood Cholesterol: A Report of the American College of Cardiology/American Heart Association Task Force on Clinical Practice Guidelines. J Am Coll Cardiol. 2019;73(24):3168-3209.  Mahesh  Chandrasekhar   Electronically Signed By: Stanly Leavens MD On: 08/14/2020 15:20  Narrative EXAM: OVER-READ INTERPRETATION  CT CHEST  The following report is an over-read performed by radiologist Dr. Toribio Aye of [facility] on 08/12/2020. This over-read does not include interpretation of cardiac or coronary anatomy or pathology. The coronary calcium score/coronary CTA interpretation by the cardiologist is attached.  COMPARISON:  None.  FINDINGS: Extracardiac findings will be described separately under dictation for contemporaneously obtained CTA chest, abdomen and pelvis.  IMPRESSION: Please see separate dictation for contemporaneously obtained CTA chest, abdomen and pelvis dated 08/12/2020 for full description of relevant extracardiac findings.  Electronically Signed: By: Toribio Aye M.D. On: 08/12/2020 11:43     ______________________________________________________________________________________________      EKG Interpretation Date/Time:  Wednesday November 29 2023  15:09:24 EDT Ventricular Rate:  70 PR Interval:  204 QRS Duration:  88 QT Interval:  390 QTC Calculation: 421 R Axis:   46  Text Interpretation: Normal sinus rhythm Low voltage QRS Cannot rule out Anteroseptal infarct (cited on or before 21-Aug-2020) When compared with ECG of 17-Nov-2022 10:02, Confirmed by Monetta Rogue ([id number]) on 11/29/2023 3:16:13 PM   Recent Labs: No results found for requested labs within last 365 days.  Recent Lipid Panel    Component Value Date/Time   CHOL 164 12/17/2020 0942   TRIG 87 12/17/2020 0942   HDL 51 12/17/2020 0942   CHOLHDL 3.2 12/17/2020 0942   LDLCALC 97 12/17/2020 0942    Physical Exam:    VS:  BP (!) 178/76   Pulse 70   Ht 5' 2 (1.575 m)   Wt 148 lb (67.1 kg)   SpO2 97%   BMI 27.07 kg/m     Wt Readings from Last 3 Encounters:  11/29/23 148 lb (67.1 kg)  05/18/23 151 lb 9.6 oz (68.8 kg)  11/17/22 152 lb (68.9 kg)     GEN:  Well nourished, well developed in no acute distress HEENT: Normal NECK: No JVD; No carotid bruits LYMPHATICS: No lymphadenopathy CARDIAC: RRR, no murmurs, rubs, gallops RESPIRATORY:  Clear to auscultation without rales, wheezing or rhonchi  ABDOMEN: Soft, non-tender, non-distended MUSCULOSKELETAL:  No edema; No deformity  SKIN: Warm and dry NEUROLOGIC:  Alert and oriented x 3 PSYCHIATRIC:  Normal affect    Signed, Rogue Monetta, MD  11/29/2023 4:39 PM    [facility]

## 2023-11-29 NOTE — Patient Instructions (Signed)
 Medication Instructions:  Your physician has recommended you make the following change in your medication:   START: Valsartan  160 mg two times daily. (If SBP is less than 110 reduce Valsartan  to 80 mg two times daily)  *If you need a refill on your cardiac medications before your next appointment, please call your pharmacy*  Lab Work: Your physician recommends that you return for lab work in:   Labs today: CMP, Lipids  If you have labs (blood work) drawn today and your tests are completely normal, you will receive your results only by: MyChart Message (if you have MyChart) OR A paper copy in the mail If you have any lab test that is abnormal or we need to change your treatment, we will call you to review the results.  Testing/Procedures: Your physician has requested that you have an echocardiogram. Echocardiography is a painless test that uses sound waves to create images of your heart. It provides your doctor with information about the size and shape of your heart and how well your heart's chambers and valves are working. This procedure takes approximately one hour. There are no restrictions for this procedure. Please do NOT wear cologne, perfume, aftershave, or lotions (deodorant is allowed). Please arrive 15 minutes prior to your appointment time.  Please note: We ask at that you not bring children with you during ultrasound (echo/ vascular) testing. Due to room size and safety concerns, children are not allowed in the ultrasound rooms during exams. Our front office staff cannot provide observation of children in our lobby area while testing is being conducted. An adult accompanying a patient to their appointment will only be allowed in the ultrasound room at the discretion of the ultrasound technician under special circumstances. We apologize for any inconvenience.   Follow-Up: At [facility], you and your health needs are our priority.  As part of our continuing mission to  provide you with exceptional heart care, our providers are all part of one team.  This team includes your primary Cardiologist (physician) and Advanced Practice Providers or APPs (Physician Assistants and Nurse Practitioners) who all work together to provide you with the care you need, when you need it.  Your next appointment:   1 year(s)  Provider:   Redell Leiter, MD    We recommend signing up for the patient portal called MyChart.  Sign up information is provided on this After Visit Summary.  MyChart is used to connect with patients for Virtual Visits (Telemedicine).  Patients are able to view lab/test results, encounter notes, upcoming appointments, etc.  Non-urgent messages can be sent to your provider as well.   To learn more about what you can do with MyChart, go to [email or web address].   Other Instructions None

## 2023-11-29 NOTE — Addendum Note (Signed)
 Addended by: SHERRE ADE I on: 11/29/2023 05:04 PM   Modules accepted: Orders

## 2023-11-30 LAB — COMPREHENSIVE METABOLIC PANEL WITH GFR
ALT: 21 IU/L (ref 0–32)
AST: 17 IU/L (ref 0–40)
Albumin: 4.3 g/dL (ref 3.7–4.7)
Alkaline Phosphatase: 118 IU/L (ref 48–129)
BUN/Creatinine Ratio: 26 (ref 12–28)
BUN: 24 mg/dL (ref 8–27)
Bilirubin Total: 0.3 mg/dL (ref 0.0–1.2)
CO2: 20 mmol/L (ref 20–29)
Calcium: 9.9 mg/dL (ref 8.7–10.3)
Chloride: 108 mmol/L — ABNORMAL HIGH (ref 96–106)
Creatinine, Ser: 0.94 mg/dL (ref 0.57–1.00)
Globulin, Total: 2.3 g/dL (ref 1.5–4.5)
Glucose: 100 mg/dL — ABNORMAL HIGH (ref 70–99)
Potassium: 4.7 mmol/L (ref 3.5–5.2)
Sodium: 143 mmol/L (ref 134–144)
Total Protein: 6.6 g/dL (ref 6.0–8.5)
eGFR: 60 mL/min/1.73 (ref >59)

## 2023-11-30 LAB — LIPID PANEL
Chol/HDL Ratio: 3.9 ratio (ref 0.0–4.4)
Cholesterol, Total: 194 mg/dL (ref 100–199)
HDL: 50 mg/dL (ref >39)
LDL Chol Calc (NIH): 108 mg/dL — ABNORMAL HIGH (ref 0–99)
Triglycerides: 207 mg/dL — ABNORMAL HIGH (ref 0–149)
VLDL Cholesterol Cal: 36 mg/dL (ref 5–40)

## 2023-12-01 ENCOUNTER — Ambulatory Visit: Payer: Self-pay

## 2023-12-15 DIAGNOSIS — Z23 Encounter for immunization: Secondary | ICD-10-CM | POA: Diagnosis not present

## 2024-01-16 ENCOUNTER — Other Ambulatory Visit: Payer: Self-pay | Admitting: Cardiology

## 2024-01-16 MED ORDER — EZETIMIBE 10 MG PO TABS: 5.0000 mg | ORAL_TABLET | Freq: Every day | ORAL | 3 refills | Status: AC

## 2024-01-16 NOTE — Telephone Encounter (Signed)
 *  STAT* If patient is at the pharmacy, call can be transferred to refill team.   1. Which medications need to be refilled? (please list name of each medication and dose if known)  carvedilol  (COREG ) 3.125 MG tablet   carvedilol  (COREG ) 6.25 MG tablet   ezetimibe  (ZETIA ) 10 MG tablet   2. Would you like to learn more about the convenience, safety, & potential cost savings by using the [facility]? No      3. Are you open to using the [facility] (Type [facility]. ).no    4. Which pharmacy/location (including street and city if local pharmacy) is medication to be sent to? [id number] - [place], [place] - [address]    5. Do they need a 30 day or 90 day supply? 90 days

## 2024-01-16 NOTE — Telephone Encounter (Signed)
 Pt is requesting both carvedilol  3.125 mg and 6.25 mg. Does pt supposed to be taking both strengths? Please address directions

## 2024-02-12 MED ORDER — CARVEDILOL 6.25 MG PO TABS: 6.2500 mg | ORAL_TABLET | Freq: Two times a day (BID) | ORAL | 2 refills | Status: AC
